# Patient Record
Sex: Male | Born: 1945
Health system: Southern US, Community
[De-identification: ages and names within clinical notes are randomized; demographics above are authoritative.]

## PROBLEM LIST (undated history)

## (undated) DIAGNOSIS — E7800 Pure hypercholesterolemia, unspecified: Secondary | ICD-10-CM

## (undated) DIAGNOSIS — N200 Calculus of kidney: Secondary | ICD-10-CM

## (undated) DIAGNOSIS — H269 Unspecified cataract: Secondary | ICD-10-CM

## (undated) HISTORY — DX: Calculus of kidney: N20.0

## (undated) HISTORY — DX: Unspecified cataract: H26.9

## (undated) HISTORY — PX: CATARACT EXTRACTION, BILATERAL: SHX1313

---

## 2012-11-21 ENCOUNTER — Ambulatory Visit (INDEPENDENT_AMBULATORY_CARE_PROVIDER_SITE_OTHER): Payer: Medicare Other | Admitting: Family Medicine

## 2012-11-21 VITALS — BP 112/70 | HR 74 | Temp 98.7°F | Resp 16 | Ht 61.5 in | Wt 141.0 lb

## 2012-11-21 DIAGNOSIS — L718 Other rosacea: Secondary | ICD-10-CM

## 2012-11-21 DIAGNOSIS — L719 Rosacea, unspecified: Secondary | ICD-10-CM | POA: Diagnosis not present

## 2012-11-21 MED ORDER — DOXYCYCLINE HYCLATE 100 MG PO TABS: 100.0000 mg | ORAL_TABLET | Freq: Two times a day (BID) | ORAL | Status: DC

## 2012-11-21 MED ORDER — CLINDAMYCIN PHOSPHATE 1 % EX GEL: Freq: Two times a day (BID) | CUTANEOUS | Status: DC

## 2012-11-21 NOTE — Addendum Note (Signed)
Addended by: Maryann Alar on: 11/21/2012 03:49 PM   Modules accepted: Level of Service

## 2012-11-21 NOTE — Patient Instructions (Addendum)
Avoid things that cause the rash to get worse: Potential triggers for flushing include: Extremes of temperature  Sunlight  Spicy foods  Alcohol  Exercise  Acute psychological stressors  Medications  Menopausal hot flashes     We will try a different medication for the face. If it does not work as well as the metronidazole, we will go back to using the metronidazole. The new medicine is clindamycin gel. I would recommend using a small amount of it on the red areas of the face twice daily every morning and evening.  We will also try using an oral medication in addition to that, doxycycline, one daily. Be cautious because people taking doxycycline will sometimes sunburn easier.  Always wear a hat if out in the sun, or sunscreen if you're going to be outdoors for a long time.

## 2012-11-21 NOTE — Progress Notes (Signed)
Subjective: Patient is here for a reassessment of his acne rosacea. He's been using metronidazole gel once daily, and does not get very good control. It is better in the wintertime worse in the summer. His face is red and inflamed. He says the medicine is extremely expensive and would like to do something because less if possible.  Objective: Extensive acne rosacea on the face, from the ears clear across and across the lower half of the forehead. It does not seem to affect the top of his head where he wears a hat. 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  Assessment: Acne rosacea, erythematous inflammatory type  Plan: See the detailed instructions I wrote out.  We will try clindamycin gel in place of the metronidazole gel and see how he does with that. It can help, though is not documented to be as reliable as metronidazole. Will also give some oral doxycycline and cautioned him about being out in the sun.  Return if he is not improving or in about 6 months.

## 2012-12-08 ENCOUNTER — Ambulatory Visit: Payer: Medicare Other

## 2012-12-08 ENCOUNTER — Ambulatory Visit (INDEPENDENT_AMBULATORY_CARE_PROVIDER_SITE_OTHER): Payer: Medicare Other | Admitting: Emergency Medicine

## 2012-12-08 VITALS — BP 118/64 | HR 65 | Temp 97.9°F | Resp 18 | Ht 61.0 in | Wt 140.0 lb

## 2012-12-08 DIAGNOSIS — Z7189 Other specified counseling: Secondary | ICD-10-CM | POA: Diagnosis not present

## 2012-12-08 NOTE — Progress Notes (Signed)
  Subjective:    Patient ID: Lee [name], male    DOB: May 16, 1946, 67 y.o.   MRN: [id number]  HPI   67 year old [place] male presents to the clinic to day asking for shingles vaccine.   Pt has no idea if he has had the chicken pox disease in the past.  Patient's mother in law contracted shingles last month.   Requests prescription for shingles vaccine.       Review of Systems     Objective:   Physical Exam Lungs clear, heart regular.        Assessment & Plan:

## 2012-12-17 ENCOUNTER — Ambulatory Visit: Payer: Medicare Other

## 2012-12-17 ENCOUNTER — Ambulatory Visit (INDEPENDENT_AMBULATORY_CARE_PROVIDER_SITE_OTHER): Payer: Medicare Other | Admitting: Family Medicine

## 2012-12-17 VITALS — BP 100/60 | HR 62 | Temp 98.1°F | Resp 16 | Ht 63.0 in | Wt 150.6 lb

## 2012-12-17 DIAGNOSIS — M549 Dorsalgia, unspecified: Secondary | ICD-10-CM | POA: Diagnosis not present

## 2012-12-17 NOTE — Patient Instructions (Addendum)
Try taking advil or tylenol as needed.  I am going to refer you to physical therapy for your back. Let me know if you are not feeling better in the next few months- Sooner if worse.

## 2012-12-17 NOTE — Progress Notes (Signed)
[facility] [address] [place] [address] [phone]  Date:  12/17/2012   Name:  Lee [name]   DOB:  1946/01/21   MRN:  [id number]  PCP:  No PCP Per Patient    Chief Complaint: Back Pain   History of Present Illness:  Lee [name] is a 67 y.o. very pleasant male patient who presents with the following:  He is here with back pain.  He feels that he is beocming bent over, "like I'm getting old."  His family has noticed this change in his posture. He notes it is difficult to straighten up, and he feels more comfortable in a hunched position.  He has noted this for a couple of years, but worse for the last 2 days or so.  He is in [employer] business and spends a lot of time bent over cooking  He feels most comfortable leaning over a shopping cart when out shopping.  He is at [employer] a lot for [employer] and has trouble picking up heavy supply boxes.    Picking up a 10 or 20 lb box is difficult for him.   He does not have any incontinence of stool or urine.     There are no active problems to display for this patient.   History reviewed. No pertinent past medical history.  History reviewed. No pertinent past surgical history.  History  Substance Use Topics  . Smoking status: Never Smoker   . Smokeless tobacco: Not on file  . Alcohol Use: Not on file    No family history on file.  No Known Allergies  Medication list has been reviewed and updated.  Current Outpatient Prescriptions on File Prior to Visit  Medication Sig Dispense Refill  . clindamycin (CLINDAGEL) 1 % gel Apply topically 2 (two) times daily.  60 g  0  . doxycycline (VIBRA-TABS) 100 MG tablet Take 1 tablet (100 mg total) by mouth 2 (two) times daily.  30 tablet  5  . metroNIDAZOLE (METROCREAM) 0.75 % cream Apply 1 application topically every morning.       No current facility-administered medications on file prior to visit.    Review of Systems:  As per HPI-  otherwise negative.   Physical Examination: Filed Vitals:   12/17/12 0856  BP: 100/60  Pulse: 62  Temp: 98.1 F (36.7 C)  Resp: 16   Filed Vitals:   12/17/12 0856  Height: 5\' 3"  (1.6 m)  Weight: 150 lb 9.6 oz (68.312 kg)   Body mass index is 26.68 kg/(m^2). Ideal Body Weight: Weight in (lb) to have BMI = 25: 140.8  GEN: WDWN, NAD, Non-toxic, A & O x 3, slightly stooped apperaance HEENT: Atraumatic, Normocephalic. Neck supple. No masses, No LAD. Ears and Nose: No external deformity. CV: RRR, No M/G/R. No JVD. No thrill. No extra heart sounds. PULM: CTA B, no wheezes, crackles, rhonchi. No retractions. No resp. distress. No accessory muscle use. ABD: S, NT, ND EXTR: No c/c/e NEURO Normal gait.  PSYCH: Normally interactive. Conversant. Not depressed or anxious appearing.  Calm demeanor.  Back: he does show changes of kyphosis in his posture, no obvious scoliosis on exam.  He has normal leg strength and sensation, negative SLR bilaterally, normal patellar DTR.    [facility] reading (PRIMARY) by  Dr. Patsy Lager. Lumbar spine: degenerative change, otherwise negative Thoracic spine:  Degenerative change  LUMBAR SPINE - 2-3 VIEW  Comparison: None.  Findings: There are five non-rib bearing lumbar-type vertebral  bodies. Intervertebral disc spaces are relatively well maintained. Multilevel marginal osteophyte formation is present representing degenerative spondylosis. No fracture, subluxation, or bony destruction is evident. SI joints appear intact.  There is mild lumbar scoliosis convexity to the left with Cobbs angle measuring 6 degrees. No kyphosis is evident. There is fecal distention of portions of the colon.  IMPRESSION: Changes of degenerative disc disease and degenerative spondylosis are present.  There is mild scoliosis convexity to the left with Cobbs angle measuring 6 degrees.  THORACIC SPINE - 2 VIEW  Comparison: None.  Findings: Ectasia and nonaneurysmal  calcification of the thoracic  aorta are seen. There is narrowing of some intervertebral disc  spaces. Multilevel marginal osteophyte formation is seen  representing degenerative spondylosis. No fracture, subluxation,  dislocation, or bony destruction is seen.  There is slight thoracic scoliosis convexity to the right with  Cobbs angle measuring 5 degrees.  IMPRESSION:  Ectasia and nonaneurysmal calcification of the thoracic aorta are  seen. Changes of degenerative disc disease and degenerative  spondylosis are present. No fracture or bony destruction is  evident.  There is slight thoracic scoliosis convexity to the right with  Cobbs angle measuring 5 degrees.   Assessment and Plan: Back pain - Plan: DG Thoracic Spine 2 View, DG Lumbar Spine 2-3 Views, Ambulatory referral to Physical Therapy  Back pain with kyphosis, may be related to chronic bending over.  Degenerative change on x-ray. Referral to PT for strengthening exercises.  Encouraged him to come in for a CPE at his convenience to evalaute his general health.   Signed Abbe Amsterdam, MD

## 2013-01-03 DIAGNOSIS — M545 Low back pain: Secondary | ICD-10-CM | POA: Diagnosis not present

## 2013-01-06 DIAGNOSIS — M545 Low back pain: Secondary | ICD-10-CM | POA: Diagnosis not present

## 2013-01-08 DIAGNOSIS — M545 Low back pain: Secondary | ICD-10-CM | POA: Diagnosis not present

## 2013-01-13 DIAGNOSIS — M545 Low back pain: Secondary | ICD-10-CM | POA: Diagnosis not present

## 2013-01-15 DIAGNOSIS — M545 Low back pain: Secondary | ICD-10-CM | POA: Diagnosis not present

## 2013-01-20 DIAGNOSIS — M545 Low back pain: Secondary | ICD-10-CM | POA: Diagnosis not present

## 2013-01-22 DIAGNOSIS — M545 Low back pain: Secondary | ICD-10-CM | POA: Diagnosis not present

## 2013-01-27 DIAGNOSIS — M545 Low back pain: Secondary | ICD-10-CM | POA: Diagnosis not present

## 2013-01-29 DIAGNOSIS — M545 Low back pain: Secondary | ICD-10-CM | POA: Diagnosis not present

## 2013-04-21 DIAGNOSIS — Z23 Encounter for immunization: Secondary | ICD-10-CM | POA: Diagnosis not present

## 2014-01-07 ENCOUNTER — Ambulatory Visit (INDEPENDENT_AMBULATORY_CARE_PROVIDER_SITE_OTHER): Payer: Medicare Other | Admitting: Family Medicine

## 2014-01-07 VITALS — BP 100/62 | HR 63 | Temp 97.8°F | Resp 16 | Ht 62.0 in | Wt 147.6 lb

## 2014-01-07 DIAGNOSIS — L719 Rosacea, unspecified: Secondary | ICD-10-CM

## 2014-01-07 DIAGNOSIS — L718 Other rosacea: Secondary | ICD-10-CM

## 2014-01-07 MED ORDER — CLINDAMYCIN PHOSPHATE 1 % EX GEL: Freq: Two times a day (BID) | CUTANEOUS | Status: DC

## 2014-01-07 MED ORDER — DOXYCYCLINE HYCLATE 100 MG PO TABS: ORAL_TABLET | ORAL | Status: DC

## 2014-01-07 NOTE — Progress Notes (Signed)
Subjective: 68, almost 68 year old man who is here again for his acne rosacea. He continues to have problems. It is worse on the right cheek than the left, though it was on the left at times. It is been bothering him more lately. 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 No other acute medical complaints.  Objective: Conjunctiva were not injected. Acne rosacea appearing rash on the right side of his face from the side of the nose down to the sides of the lips. Mother on the right, little bit of left. No carotid bruits.  Assessment: Acne rosacea  Plan: Continue using the clindamycin gel, but also take doxycycline one twice daily. He is to do this for 3 weeks, then decrease to once daily. If he is not doing better he is to come back, otherwise he is to continue using the oral doxycycline for the summer months. He is cautioned about the risks of sunburn.  He says his high body is not doing well, he has trouble standing straight. I advised him to come in for a complete physical examination sometime in the not too distant future. He will come back after he goes to [place] in early July. He works 7 days a week all day.

## 2014-01-07 NOTE — Patient Instructions (Signed)
Continue using the clindamycin gel twice daily if bad, once daily if doing well  Take the doxycycline one twice daily for 3 weeks, then decrease to one once daily. Take with breakfast and supper.  Be cautious about excessive sun as you may burn a little bit he's you're on this medication  Return if worse or not improving

## 2014-03-19 DIAGNOSIS — Z23 Encounter for immunization: Secondary | ICD-10-CM | POA: Diagnosis not present

## 2014-05-09 IMAGING — CR DG LUMBAR SPINE 2-3V
2 series · 2 of 2 positions shown · non-contrast
Comparison: None.

CLINICAL DATA: History of back pain.  Evaluate scoliosis.

LUMBAR SPINE - 2-3 VIEW

[AP]
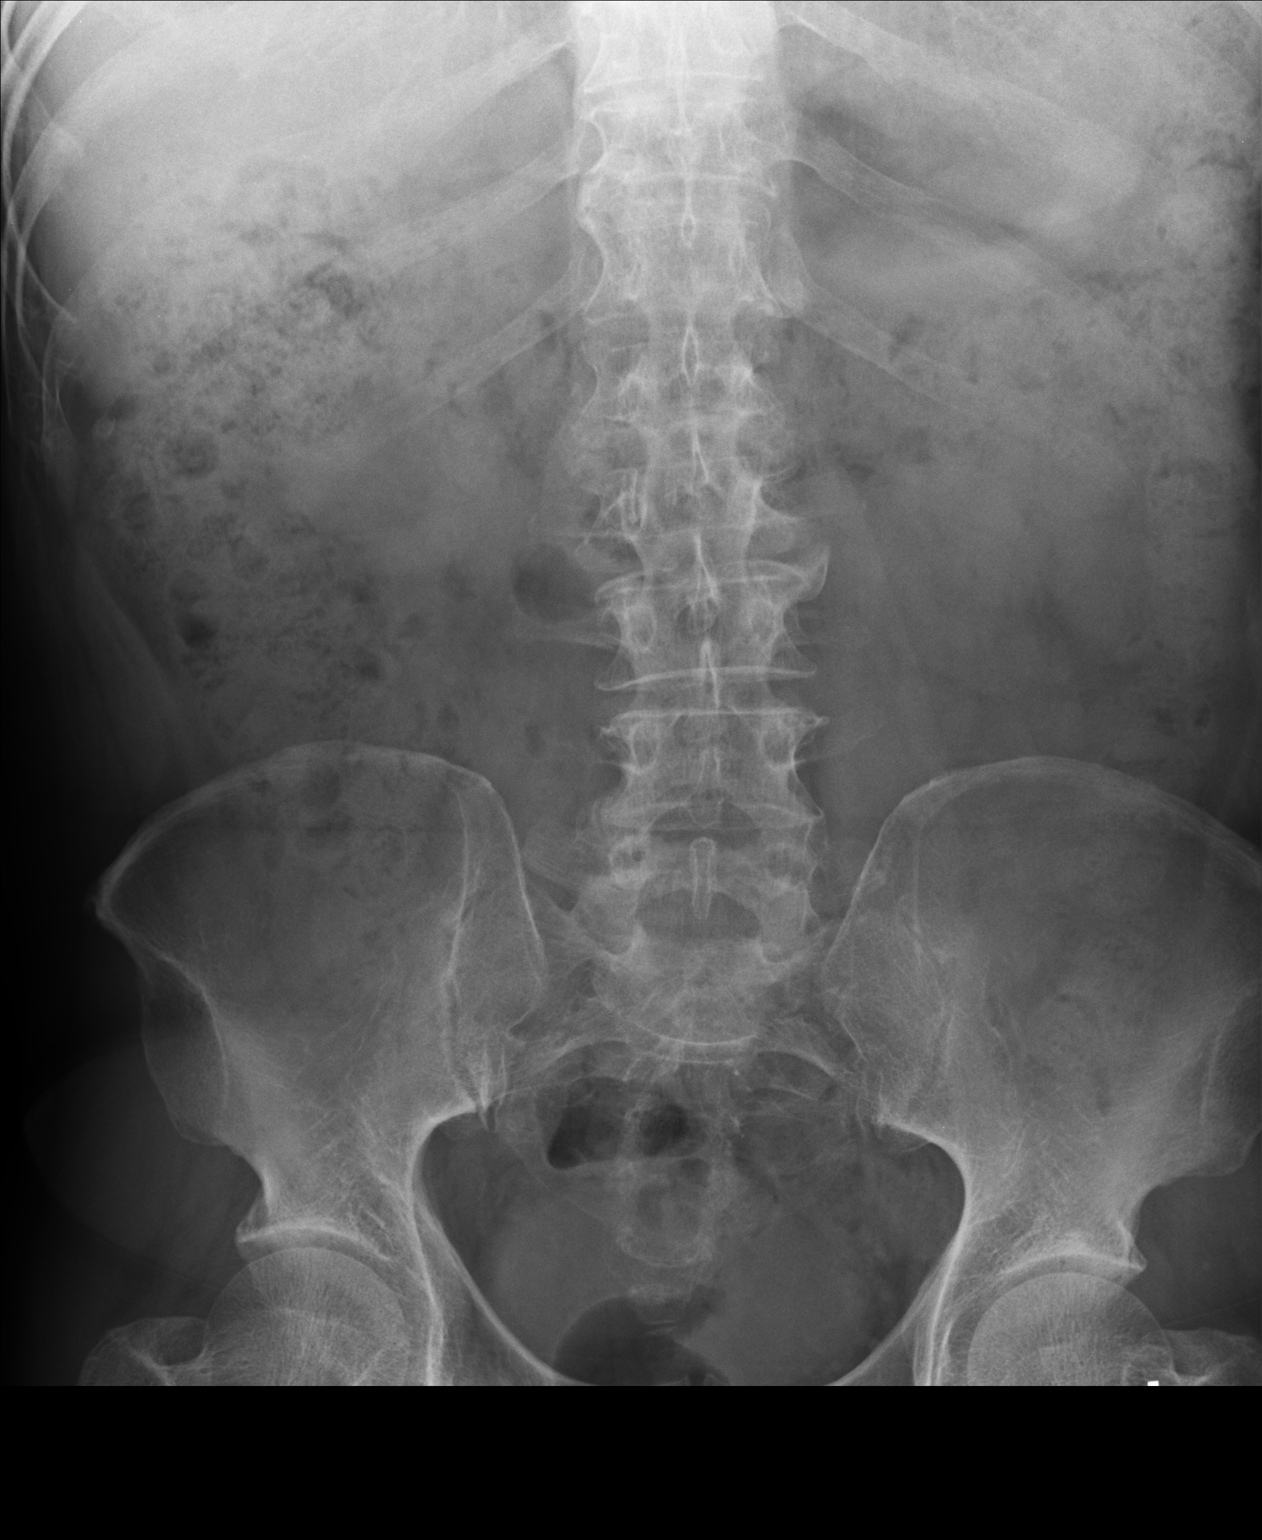

[lateral]
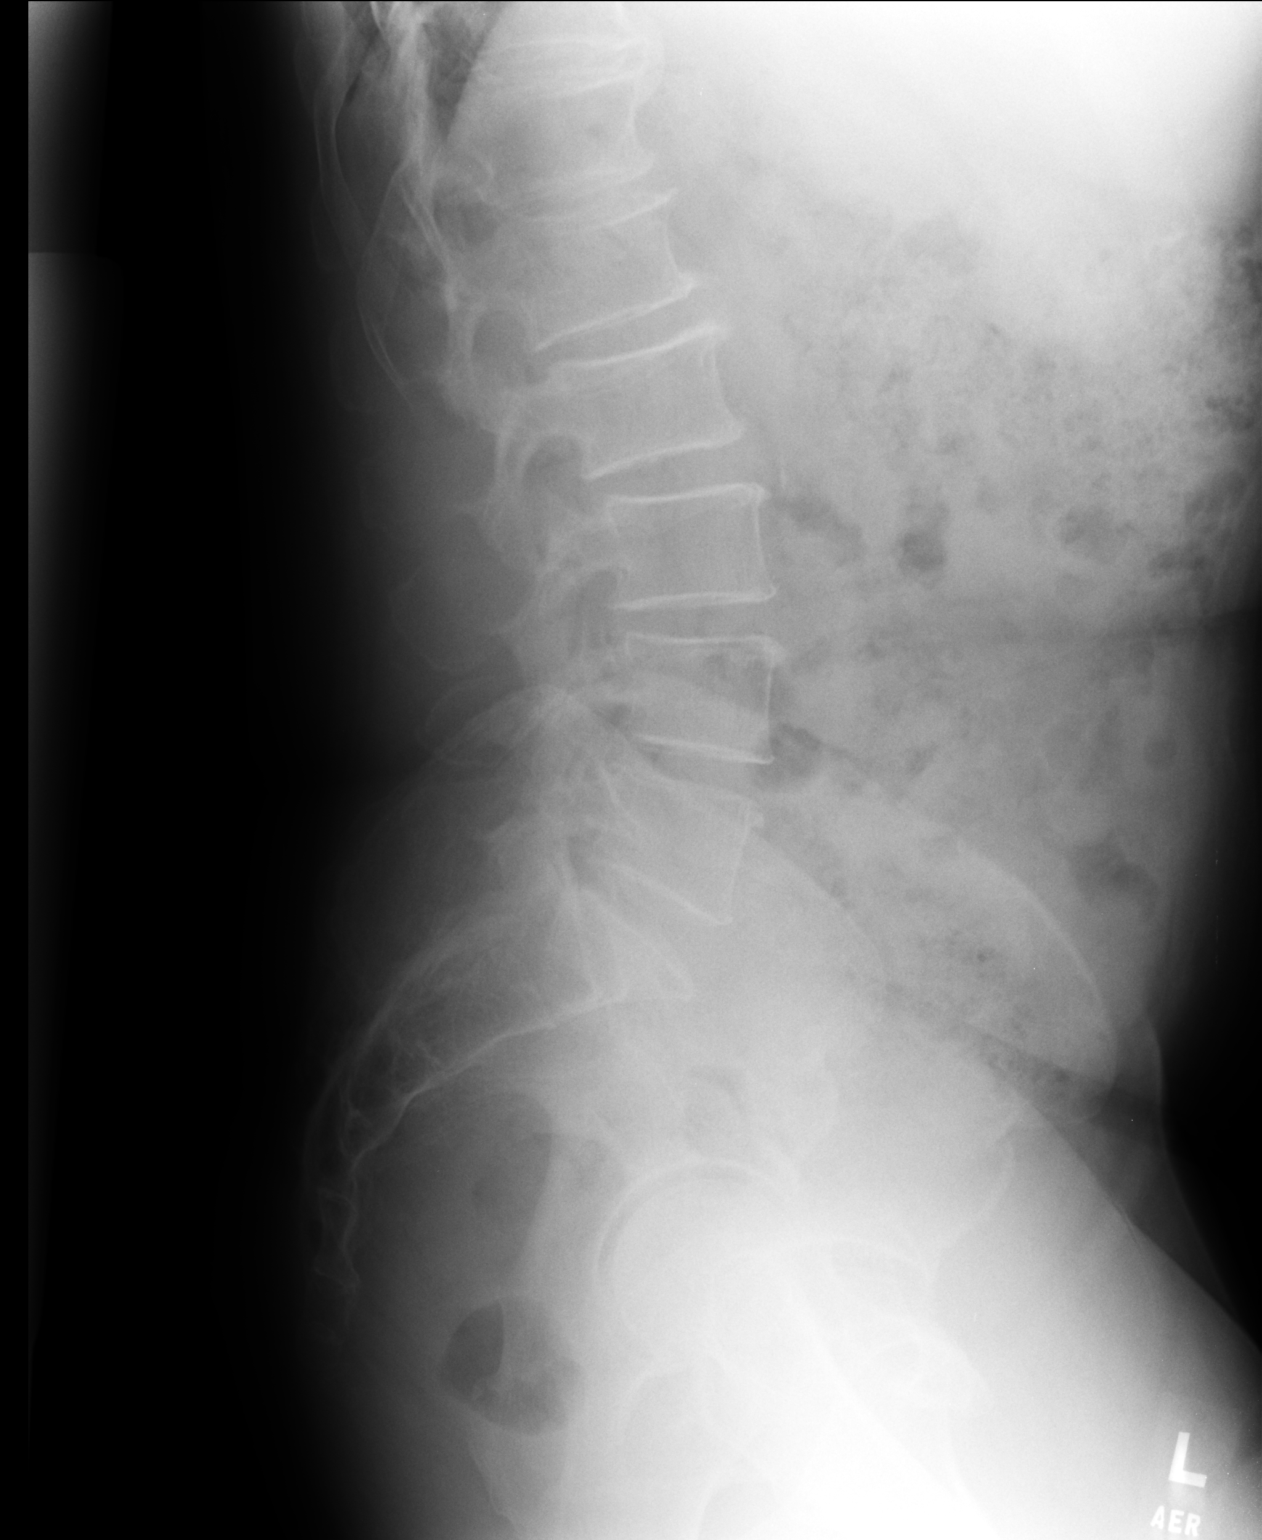

[2 of 2 positions shown; findings below may reference images not displayed]

FINDINGS: There are five non-rib bearing lumbar-type vertebral
bodies.  Intervertebral disc spaces are relatively well maintained.
Multilevel marginal osteophyte formation is present representing
degenerative spondylosis.  No fracture, subluxation, or bony
destruction is evident.  SI joints appear intact.

There is mild lumbar scoliosis convexity to the left with Cobbs
angle measuring 6 degrees.  No kyphosis is evident.  There is fecal
distention of portions of the colon.
IMPRESSION: Changes of degenerative disc disease and degenerative spondylosis
are present.

There is mild scoliosis convexity to the left with Cobbs angle
measuring 6 degrees.

## 2014-08-07 HISTORY — PX: COLONOSCOPY: SHX174

## 2014-08-07 HISTORY — PX: POLYPECTOMY: SHX149

## 2014-11-13 ENCOUNTER — Encounter: Payer: Self-pay | Admitting: Internal Medicine

## 2014-12-27 ENCOUNTER — Ambulatory Visit (INDEPENDENT_AMBULATORY_CARE_PROVIDER_SITE_OTHER): Payer: Medicare Other | Admitting: Physician Assistant

## 2014-12-27 ENCOUNTER — Ambulatory Visit (INDEPENDENT_AMBULATORY_CARE_PROVIDER_SITE_OTHER): Payer: Medicare Other

## 2014-12-27 VITALS — BP 120/68 | HR 61 | Temp 98.0°F | Resp 12 | Ht 61.5 in | Wt 142.0 lb

## 2014-12-27 DIAGNOSIS — R109 Unspecified abdominal pain: Secondary | ICD-10-CM

## 2014-12-27 LAB — POCT URINALYSIS DIPSTICK
Bilirubin, UA: NEGATIVE
Blood, UA: NEGATIVE
Glucose, UA: NEGATIVE
Ketones, UA: NEGATIVE
LEUKOCYTES UA: NEGATIVE
NITRITE UA: NEGATIVE
Protein, UA: NEGATIVE
Spec Grav, UA: 1.01
UROBILINOGEN UA: 0.2
pH, UA: 7

## 2014-12-27 MED ORDER — MELOXICAM 15 MG PO TABS: 15.0000 mg | ORAL_TABLET | Freq: Every day | ORAL | Status: DC | PRN

## 2014-12-27 NOTE — Progress Notes (Signed)
  Medical screening examination/treatment/procedure(s) were performed by non-physician practitioner and as supervising physician I was immediately available for consultation/collaboration.     

## 2014-12-27 NOTE — Patient Instructions (Signed)
Heat or ice to the area whichever feels the best on the area that is hurting.  Be careful picking up heavy things.

## 2014-12-27 NOTE — Progress Notes (Signed)
Subjective:    Patient ID: Lee [name], male    DOB: Jun 03, 1946, 69 y.o.   MRN: [id number]  HPI Pt presents to clinic with right mid back pain that started last night and has not changed.  It hurts more with movement esp changing from sitting to standing.  He did not have an injury that he knows of.  He was working last night at [employer] but doing nothing different than normal.  He ate 2 donuts before bed but he had no abd pain or nausea.  He is having no urinary symptoms other than he does to the bedroom at lot but he states it has not changed and he drinks a lot of water during the day.  Review of Systems  Constitutional: Negative for fever and chills.  Gastrointestinal: Negative for nausea, abdominal pain and diarrhea.  Genitourinary: Positive for frequency (no change for the patient). Negative for urgency and hematuria.  Musculoskeletal: Positive for back pain.   There are no active problems to display for this patient.  The patient has a current medication list which includes the following prescription(s): pyridoxine. No Known Allergies  Medications, allergies, past medical history, surgical history, family history, social history and problem list reviewed and updated.     Objective:   Physical Exam  Constitutional: He is oriented to person, place, and time. He appears well-developed and well-nourished.  BP 120/68 mmHg  Pulse 61  Temp(Src) 98 F (36.7 C) (Oral)  Resp 12  Ht 5' 1.5" (1.562 m)  Wt 142 lb (64.411 kg)  BMI 26.40 kg/m2  SpO2 98%   HENT:  Head: Normocephalic and atraumatic.  Right Ear: External ear normal.  Left Ear: External ear normal.  Eyes: Conjunctivae are normal.  Neck: Normal range of motion.  Cardiovascular: Normal rate, regular rhythm and normal heart sounds.   No murmur heard. Pulmonary/Chest: Effort normal and breath sounds normal. He has no wheezes.  Musculoskeletal:       Thoracic back: Normal.       Lumbar back: Normal.   Arms: Significant kyphotic upper back.  Neurological: He is alert and oriented to person, place, and time. He has normal strength. No sensory deficit.  Reflex Scores:      Patellar reflexes are 2+ on the right side and 2+ on the left side.      Achilles reflexes are 2+ on the right side and 2+ on the left side. Skin: Skin is warm and dry.  Psychiatric: He has a normal mood and affect. His behavior is normal. Judgment and thought content normal.    Results for orders placed or performed in visit on 12/27/14  POCT urinalysis dipstick  Result Value Ref Range   Color, UA yellow    Clarity, UA clear    Glucose, UA neg    Bilirubin, UA neg    Ketones, UA neg    Spec Grav, UA 1.010    Blood, UA neg    pH, UA 7.0    Protein, UA neg    Urobilinogen, UA 0.2    Nitrite, UA neg    Leukocytes, UA Negative    [facility] reading (PRIMARY) by  Dr. Ouida Sills.  Neg      Assessment & Plan:  Right flank pain - Plan: DG Ribs Unilateral W/Chest Right, POCT urinalysis dipstick, meloxicam (MOBIC) 15 MG tablet   This is most likely a MSK injury. There is no rib fracture seen.  We will treat with NSAIDs and he will RTC  if his pain changes or he develops other symptoms.  Windell Hummingbird PA-C  [facility] 12/27/2014 9:54 AM

## 2015-01-06 ENCOUNTER — Encounter: Payer: Self-pay | Admitting: Internal Medicine

## 2015-01-06 ENCOUNTER — Ambulatory Visit (INDEPENDENT_AMBULATORY_CARE_PROVIDER_SITE_OTHER): Payer: Medicare Other | Admitting: Internal Medicine

## 2015-01-06 VITALS — BP 136/66 | HR 64 | Ht 61.0 in | Wt 146.1 lb

## 2015-01-06 DIAGNOSIS — K219 Gastro-esophageal reflux disease without esophagitis: Secondary | ICD-10-CM

## 2015-01-06 DIAGNOSIS — R1013 Epigastric pain: Secondary | ICD-10-CM | POA: Diagnosis not present

## 2015-01-06 DIAGNOSIS — Z1211 Encounter for screening for malignant neoplasm of colon: Secondary | ICD-10-CM | POA: Diagnosis not present

## 2015-01-06 MED ORDER — NA SULFATE-K SULFATE-MG SULF 17.5-3.13-1.6 GM/177ML PO SOLN: 1.0000 | Freq: Once | ORAL | Status: DC

## 2015-01-06 NOTE — Patient Instructions (Signed)
You have been scheduled for an endoscopy and colonoscopy. Please follow the written instructions given to you at your visit today. Please pick up your prep supplies at the pharmacy within the next 1-3 days. If you use inhalers (even only as needed), please bring them with you on the day of your procedure.  

## 2015-01-06 NOTE — Progress Notes (Signed)
HISTORY OF PRESENT ILLNESS:  Lee [name] is a 69 y.o. male , [name] who has lived in the [place] states since 1972, who is self referred regarding screening colonoscopy and epigastric discomfort. The patient reports no prior history of GI evaluations. He was encouraged to be seen by his wife. Patient reports intermittent problems with epigastric discomfort which she describes as a sour stomach. For this he takes an acids or Pepto-Bismol with relief. He does not describe this as severe pain. Occasional heartburn. No nausea, vomiting, dysphagia, or weight loss. His father had stomach cancer. GI review of systems is otherwise negative. No family history of colon cancer. The patient owns restaurant . He receives as needed medical care from [facility], Dr. Linna Darner  REVIEW OF SYSTEMS:  All non-GI ROS negative upon complete review  Past Medical History  Diagnosis Date  . Kidney stones     History reviewed. No pertinent past surgical history.  Social History Jyden Corbit  reports that he has never smoked. He has never used smokeless tobacco. He reports that he does not drink alcohol or use illicit drugs.  family history includes Stomach cancer in his father.  No Known Allergies     PHYSICAL EXAMINATION: Vital signs: BP 136/66 mmHg  Pulse 64  Ht 5\' 1"  (1.549 m)  Wt 146 lb 2 oz (66.282 kg)  BMI 27.62 kg/m2  Constitutional: generally well-appearing, no acute distress Psychiatric: alert and oriented x3, cooperative Eyes: extraocular movements intact, anicteric, conjunctiva pink Mouth: oral pharynx moist, no lesions Neck: supple no lymphadenopathy Cardiovascular: heart regular rate and rhythm, no murmur Lungs: clear to auscultation bilaterally Abdomen: soft, nontender, nondistended, no obvious ascites, no peritoneal signs, normal bowel sounds, no organomegaly Rectal: Deferred until colonoscopy Extremities: no lower extremity edema bilaterally Skin: no  lesions on visible extremities Neuro: No focal deficits. No asterixis.    ASSESSMENT:  #1. Epigastric discomfort/dyspepsia. May be GERD equivalent. Infrequent and managed with an acids and Pepto-Bismol #2. Family history of gastric cancer in his father #3. Screening colonoscopy. Baseline risk without contraindication. The patient is motivated  PLAN:  #1. Diagnostic upper endoscopy.The nature of the procedure, as well as the risks, benefits, and alternatives were carefully and thoroughly reviewed with the patient. Ample time for discussion and questions allowed. The patient understood, was satisfied, and agreed to proceed. #2. Consider more regular acid suppressive therapy pending findings. Otherwise an acids as needed if unremarkable. Rule out Helicobacter pylori given family history and ethnicity #3. Screening colonoscopy.The nature of the procedure, as well as the risks, benefits, and alternatives were carefully and thoroughly reviewed with the patient. Ample time for discussion and questions allowed. The patient understood, was satisfied, and agreed to proceed.

## 2015-02-17 ENCOUNTER — Ambulatory Visit (AMBULATORY_SURGERY_CENTER): Payer: Medicare Other | Admitting: Internal Medicine

## 2015-02-17 ENCOUNTER — Encounter: Payer: Self-pay | Admitting: Internal Medicine

## 2015-02-17 VITALS — BP 117/62 | HR 63 | Temp 98.3°F | Resp 20 | Ht 61.0 in | Wt 146.0 lb

## 2015-02-17 DIAGNOSIS — D125 Benign neoplasm of sigmoid colon: Secondary | ICD-10-CM | POA: Diagnosis not present

## 2015-02-17 DIAGNOSIS — Z1211 Encounter for screening for malignant neoplasm of colon: Secondary | ICD-10-CM | POA: Diagnosis not present

## 2015-02-17 DIAGNOSIS — R1013 Epigastric pain: Secondary | ICD-10-CM | POA: Diagnosis not present

## 2015-02-17 DIAGNOSIS — K219 Gastro-esophageal reflux disease without esophagitis: Secondary | ICD-10-CM | POA: Diagnosis not present

## 2015-02-17 MED ORDER — SODIUM CHLORIDE 0.9 % IV SOLN: 500.0000 mL | INTRAVENOUS | Status: DC

## 2015-02-17 NOTE — Progress Notes (Signed)
A/ox3, pleased with MAC, report to RN 

## 2015-02-17 NOTE — Op Note (Signed)
[place]  [address]. [place], [address]   COLONOSCOPY PROCEDURE REPORT  PATIENT: Lee [name], Lee [name]  MR#: [id number] BIRTHDATE: Dec 22, 1945 , 20  yrs. old GENDER: male ENDOSCOPIST: Eustace Quail, MD REFERRED MI:WOEHO Webber, [place]. PROCEDURE DATE:  02/17/2015 PROCEDURE:   Colonoscopy, screening and Colonoscopy with snare polypectomy x 1 First Screening Colonoscopy - Avg.  risk and is 50 yrs.  old or older Yes.  Prior Negative Screening - Now for repeat screening. N/A  History of Adenoma - Now for follow-up colonoscopy & has been > or = to 3 yrs.  N/A  Polyps removed today? Yes ASA CLASS:   Class I INDICATIONS:Screening for colonic neoplasia and Colorectal Neoplasm Risk Assessment for this procedure is average risk. MEDICATIONS: Monitored anesthesia care and Propofol 220 mg IV  DESCRIPTION OF PROCEDURE:   After the risks benefits and alternatives of the procedure were thoroughly explained, informed consent was obtained.  The digital rectal exam revealed no abnormalities of the rectum.   The LB [id number]  endoscope was introduced through the anus and advanced to the cecum, which was identified by both the appendix and ileocecal valve. No adverse events experienced.   The quality of the prep was excellent. (Suprep was used)  The instrument was then slowly withdrawn as the colon was fully examined. Estimated blood loss is zero unless otherwise noted in this procedure report.   COLON FINDINGS: The examined terminal ileum appeared to be normal. A single polyp measuring 3 mm in size was found in the sigmoid colon.  A polypectomy was performed with a cold snare.  The resection was complete, the polyp tissue was completely retrieved and sent to histology.   The examination was otherwise normal. Retroflexed views revealed internal hemorrhoids. The time to cecum = 1.9 Withdrawal time = 14.3   The scope was withdrawn and the procedure completed. COMPLICATIONS:  There were no immediate complications.  ENDOSCOPIC IMPRESSION: 1.   The examined terminal ileum appeared to be normal 2.   Single polyp was found in the sigmoid colon; polypectomy was performed with a cold snare 3.   The examination was otherwise normal  RECOMMENDATIONS: 1.  Repeat colonoscopy in 5 years if polyp adenomatous; otherwise 10 years 2.  Upper endoscopy today (please see report)  eSigned:  Eustace Quail, MD 02/17/2015 3:15 PM   cc: The Patient and Bernita Buffy [facility]

## 2015-02-17 NOTE — Patient Instructions (Signed)

## 2015-02-17 NOTE — Progress Notes (Signed)
Called to room to assist during endoscopic procedure.  Patient ID and intended procedure confirmed with present staff. Received instructions for my participation in the procedure from the performing physician.  

## 2015-02-17 NOTE — Op Note (Signed)
[place]  [address]. [place], [address]   ENDOSCOPY PROCEDURE REPORT  PATIENT: Lee [name], Lee [name]  MR#: [id number] BIRTHDATE: [birth year] , 52  yrs. old GENDER: male ENDOSCOPIST: Eustace Quail, MD REFERRED BY:  Bernita Buffy, PAC. PROCEDURE DATE:  02/17/2015 PROCEDURE:  EGD w/ biopsy ASA CLASS:     Class I INDICATIONS:  dyspepsia.  . Father with a history of gastric cancer MEDICATIONS: Monitored anesthesia care and Propofol 80 mg IV TOPICAL ANESTHETIC: none  DESCRIPTION OF PROCEDURE: After the risks benefits and alternatives of the procedure were thoroughly explained, informed consent was obtained.  The LB [id number] endoscope was introduced through the mouth and advanced to the second portion of the duodenum , Without limitations.  The instrument was slowly withdrawn as the mucosa was fully examined.    EXAM: The esophagus and gastroesophageal junction were completely normal in appearance.  The stomach was entered and closely examined.The antrum, angularis, and lesser curvature were well visualized, including a retroflexed view of the cardia and fundus. The stomach wall was normally distensable.  The scope passed easily through the pylorus into the duodenum.  Retroflexed views revealed no abnormalities.     The scope was then withdrawn from the patient and the procedure completed.  COMPLICATIONS: There were no immediate complications.  ENDOSCOPIC IMPRESSION: 1. Normal EGD 2. Status post CLO biopsy to rule out Helicobacter pylori  RECOMMENDATIONS: 1.  Continue current medications 2.  Rx CLO if positive  REPEAT EXAM:  eSigned:  Eustace Quail, MD 02/17/2015 3:18 PM    CC:The Patient and Bernita Buffy PA

## 2015-02-22 ENCOUNTER — Encounter: Payer: Self-pay | Admitting: Internal Medicine

## 2015-02-23 LAB — HELICOBACTER PYLORI SCREEN-BIOPSY: UREASE: POSITIVE — AB

## 2015-03-03 ENCOUNTER — Other Ambulatory Visit: Payer: Self-pay

## 2015-03-03 MED ORDER — AMOXICILL-CLARITHRO-LANSOPRAZ PO MISC: Freq: Two times a day (BID) | ORAL | Status: DC

## 2015-03-05 ENCOUNTER — Telehealth: Payer: Self-pay | Admitting: Internal Medicine

## 2015-03-05 NOTE — Telephone Encounter (Signed)
Prev-pak is not covered by insurance.  Pharmacy will give him all the separate components.  Amoxicillin 1000 mg bid Clarithromycin 500 mg BID  Prevacid 30 mg BID  Wife notified

## 2015-03-08 ENCOUNTER — Telehealth: Payer: Self-pay

## 2015-03-08 NOTE — Telephone Encounter (Signed)
Prevpac approved

## 2015-03-08 NOTE — Telephone Encounter (Signed)
Faxed prior authorization for Prevpac.  Awaiting response.

## 2015-04-23 DIAGNOSIS — Z23 Encounter for immunization: Secondary | ICD-10-CM | POA: Diagnosis not present

## 2015-05-28 DIAGNOSIS — H2513 Age-related nuclear cataract, bilateral: Secondary | ICD-10-CM | POA: Diagnosis not present

## 2015-06-15 ENCOUNTER — Ambulatory Visit (INDEPENDENT_AMBULATORY_CARE_PROVIDER_SITE_OTHER): Payer: Medicare Other

## 2015-06-15 ENCOUNTER — Ambulatory Visit (INDEPENDENT_AMBULATORY_CARE_PROVIDER_SITE_OTHER): Payer: Medicare Other | Admitting: Internal Medicine

## 2015-06-15 VITALS — BP 122/70 | HR 61 | Temp 98.1°F | Resp 16 | Ht 62.0 in | Wt 146.0 lb

## 2015-06-15 DIAGNOSIS — M25511 Pain in right shoulder: Secondary | ICD-10-CM

## 2015-06-15 MED ORDER — METHYLPREDNISOLONE ACETATE 80 MG/ML IJ SUSP
80.0000 mg | Freq: Once | INTRAMUSCULAR | Status: AC
  Administered 2015-06-15: 80 mg via INTRAMUSCULAR

## 2015-06-15 MED ORDER — IBUPROFEN 600 MG PO TABS: 600.0000 mg | ORAL_TABLET | Freq: Three times a day (TID) | ORAL | Status: DC | PRN

## 2015-06-15 NOTE — Patient Instructions (Signed)

## 2015-06-15 NOTE — Progress Notes (Signed)
Patient ID: Lee [name], male   DOB: July 04, 1946, 69 y.o.   MRN: [id number]   06/15/2015 at 9:59 AM  Lee [name] / DOB: 1946/01/25 / MRN: [id number]  Problem list reviewed and updated by me where necessary.   SUBJECTIVE  Lee [name] is a 69 y.o. 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 The pain is posterior shoulder, worse with abduction..     He  has a past medical history of Kidney stones.    Medications reviewed and updated by myself where necessary, and exist elsewhere in the encounter.   Lee [name] has No Known Allergies. He  reports that he has never smoked. He has never used smokeless tobacco. He reports that he does not drink alcohol or use illicit drugs. He  has no sexual activity history on file. The patient  has no past surgical history on file.  His family history includes Stomach cancer in his father. There is no history of Colon cancer.  Review of Systems  Constitutional: Negative.  Negative for fever.  Respiratory: Negative.  Negative for shortness of breath.   Cardiovascular: Negative for chest pain.  Gastrointestinal: Negative for nausea.  Musculoskeletal: Positive for joint pain.  Skin: Negative for rash.  Neurological: Negative for dizziness and headaches.    OBJECTIVE  His  height is 5\' 2"  (1.575 m) and weight is 146 lb (66.225 kg). His oral temperature is 98.1 F (36.7 C). His blood pressure is 122/70 and his pulse is 61. His respiration is 16 and oxygen saturation is 98%.  The patient's body mass index is 26.7 kg/(m^2).  Physical Exam  Constitutional: He is oriented to person, place, and time. He appears well-developed and well-nourished. No distress.  HENT:  Head: Normocephalic.  Nose: Nose normal.  Eyes: Conjunctivae and EOM are normal.  Respiratory: Effort normal.  Musculoskeletal: He exhibits tenderness. He exhibits no edema.   Right shoulder: He exhibits tenderness, pain and spasm. He exhibits normal range of motion, no bony tenderness, no swelling, no effusion, no crepitus, no deformity, normal pulse and normal strength.       Arms: Point tender at x  Neurological: He is alert and oriented to person, place, and time. He exhibits normal muscle tone. Coordination normal.  Skin: No rash noted.  Psychiatric: He has a normal mood and affect.   [facility] reading (PRIMARY) by  Dr.Ica Daye.normal shoulder xray, bone cyst seen at elbow.    No results found for this or any previous visit (from the past 24 hour(s)).  ASSESSMENT & PLAN  Lee [name] was seen today for shoulder pain.  Diagnoses and all orders for this visit:  Pain in joint of right shoulder -     DG Shoulder Right; Future -     methylPREDNISolone acetate (DEPO-MEDROL) injection 80 mg; Inject 1 mL (80 mg total) into the muscle once. -     ibuprofen (ADVIL,MOTRIN) 600 MG tablet; Take 1 tablet (600 mg total) by mouth every 8 (eight) hours as needed.  Use aspercream

## 2015-11-02 ENCOUNTER — Ambulatory Visit (INDEPENDENT_AMBULATORY_CARE_PROVIDER_SITE_OTHER): Payer: Medicare Other

## 2015-11-02 ENCOUNTER — Ambulatory Visit (INDEPENDENT_AMBULATORY_CARE_PROVIDER_SITE_OTHER): Payer: Medicare Other | Admitting: Physician Assistant

## 2015-11-02 VITALS — BP 116/64 | HR 73 | Temp 98.4°F | Resp 18 | Ht 62.0 in | Wt 144.0 lb

## 2015-11-02 DIAGNOSIS — M546 Pain in thoracic spine: Secondary | ICD-10-CM

## 2015-11-02 DIAGNOSIS — R0781 Pleurodynia: Secondary | ICD-10-CM | POA: Diagnosis not present

## 2015-11-02 MED ORDER — CYCLOBENZAPRINE HCL 5 MG PO TABS: 5.0000 mg | ORAL_TABLET | Freq: Three times a day (TID) | ORAL | Status: DC | PRN

## 2015-11-02 MED ORDER — MELOXICAM 7.5 MG PO TABS: 7.5000 mg | ORAL_TABLET | Freq: Every day | ORAL | Status: DC

## 2015-11-02 NOTE — Progress Notes (Signed)
   Lee [name]  MRN: [id number] DOB: 1946-01-25  Subjective:  Pt presents to clinic with left side pain.  Two days ago he was working and bent forward to pick up a box hurt his left thoracic back/midaxillary ribs. Heard a loud noise, describes as cracking, and developed pain his his back. Mild pain for the past two days, today the sharp pain increased in severity. No pain with heavy lifting, endorses pain with lateral rotation of trunk. Currently taking advil and tylenol 1 pill each which provides minimal relief. Denies pain with coughing, denies shortness of breath or pain on inspiration. Denies pain radiation to the arms or legs.   There are no active problems to display for this patient.   Current Outpatient Prescriptions on File Prior to Visit  Medication Sig Dispense Refill  . ibuprofen (ADVIL,MOTRIN) 600 MG tablet Take 1 tablet (600 mg total) by mouth every 8 (eight) hours as needed. (Patient not taking: Reported on 11/02/2015) 30 tablet 0  . pyridOXINE (VITAMIN B-6) 100 MG tablet Take 100 mg by mouth daily. Reported on 11/02/2015     No current facility-administered medications on file prior to visit.    No Known Allergies  Review of Systems  Respiratory: Negative for cough and shortness of breath.   Musculoskeletal:       Left rib pain   Objective:  BP 116/64 mmHg  Pulse 73  Temp(Src) 98.4 F (36.9 C)  Resp 18  Ht 5\' 2"  (1.575 m)  Wt 144 lb (65.318 kg)  BMI 26.33 kg/m2  SpO2 95%  Physical Exam  Constitutional: He is oriented to person, place, and time and well-developed, well-nourished, and in no distress.  HENT:  Head: Normocephalic and atraumatic.  Right Ear: External ear normal.  Left Ear: External ear normal.  Eyes: Conjunctivae are normal.  Neck: Normal range of motion.  Cardiovascular: Normal rate, regular rhythm and normal heart sounds.   No murmur heard. Pulmonary/Chest: Effort normal and breath sounds normal. He has no wheezes.    Neurological: He  is alert and oriented to person, place, and time. Gait normal.  Skin: Skin is warm and dry.  Psychiatric: Mood, memory, affect and judgment normal.   Dg Ribs Unilateral W/chest Left  11/02/2015  CLINICAL DATA:  Left side rib pain, left thoracic back pain EXAM: LEFT RIBS AND CHEST - 3+ VIEW COMPARISON:  12/27/2014 FINDINGS: Three views left side ribs submitted. No infiltrate or pulmonary edema. No rib fracture is identified. Probable old fracture of the left eighth rib. There is no pneumothorax. IMPRESSION: Negative. Electronically Signed   By: Lahoma Crocker M.D.   On: 11/02/2015 09:08   Assessment and Plan :  Left-sided thoracic back pain - Plan: DG Ribs Unilateral W/Chest Left, cyclobenzaprine (FLEXERIL) 5 MG tablet, meloxicam (MOBIC) 7.5 MG tablet   No rib fractures seen today and with the large generalized area of pain - it is likely this is muscle related - we will treat with muscle relaxers and NSAIDs.  Windell Hummingbird PA-C  [facility] 11/02/2015 9:42 AM

## 2015-11-02 NOTE — Progress Notes (Signed)
Subjective:     Patient ID: Lee [name], male   DOB: 1945/12/21, 70 y.o.   MRN: [id number]  HPI The patient is a 70 year old male with no significant PMH. Two days ago he was working and bent forward to pick up a box hurt his left thoracic back. Heard a loud noise, describes as cracking, and developed pain his his back. Mild pain for the past two days, today the sharp pain increased in severity. No pain with heavy lifting, endorses pain with lateral rotation of trunk. Currently taking advil and tylenol 1 pill each which provides minimal relief. Denies pain with coughing, denies shortness of breath or pain on inspiration. Denies pain radiation to the arms or legs.  Review of Systems  Constitutional: Negative for fever and chills.  Respiratory: Negative for cough and shortness of breath.   Cardiovascular: Negative for chest pain and palpitations.  Gastrointestinal: Negative for abdominal pain.       Positive for nausea yesterday  Genitourinary: Negative for dysuria and frequency.  Neurological: Negative for dizziness and headaches.       Objective:   Physical Exam  Constitutional: He appears well-developed and well-nourished.  HENT:  Head: Normocephalic and atraumatic.  Cardiovascular: Normal rate, regular rhythm, normal heart sounds and intact distal pulses.  Exam reveals no gallop and no friction rub.   No murmur heard. Pulmonary/Chest: Breath sounds normal. No respiratory distress. He has no wheezes. He has no rales. He exhibits tenderness.  Musculoskeletal: He exhibits tenderness. He exhibits no edema.       Arms: Significant pain and guarding with palpation over the left mid axillary 4-6 intercostal margins.   Dg Ribs Unilateral W/chest Left  11/02/2015  CLINICAL DATA:  Left side rib pain, left thoracic back pain EXAM: LEFT RIBS AND CHEST - 3+ VIEW COMPARISON:  12/27/2014 FINDINGS: Three views left side ribs submitted. No infiltrate or pulmonary edema. No rib fracture is  identified. Probable old fracture of the left eighth rib. There is no pneumothorax. IMPRESSION: Negative. Electronically Signed   By: Lahoma Crocker M.D.   On: 11/02/2015 09:08      Assessment:     The patient is a 70 year old relatively healthy male with onset of acute left sided trunk pain when bending two days ago. CXR ordered to r/o rib fractures. No acute fractures noted, will treat symptomatically for left sided trunk pain and encourage patient to follow up if symptoms do not resolve within two weeks    Plan:     1. Left-sided thoracic back pain - DG Ribs Unilateral W/Chest Left; Future - cyclobenzaprine (FLEXERIL) 5 MG tablet; Take 1 tablet (5 mg total) by mouth 3 (three) times daily as needed for muscle spasms.  Dispense: 30 tablet; Refill: 0 - meloxicam (MOBIC) 7.5 MG tablet; Take 1-2 tablets (7.5-15 mg total) by mouth daily.  Dispense: 30 tablet; Refill: 0 - Continue to use heating pad in the area - Rest and take deeps breaths to increase lung volume - Follow up in two weeks if symptoms do not resolve

## 2015-11-02 NOTE — Patient Instructions (Addendum)
Continue warm compresses - it is really important to take deep breaths   IF you received an x-ray today, you will receive an invoice from [facility]. Please contact [facility] at [phone] with questions or concerns regarding your invoice.   IF you received labwork today, you will receive an invoice from [employer]. Please contact [facility] at [phone] with questions or concerns regarding your invoice.   Our billing staff will not be able to assist you with questions regarding bills from these companies.  You will be contacted with the lab results as soon as they are available. The fastest way to get your results is to activate your My Chart account. Instructions are located on the last page of this paperwork. If you have not heard from [place] regarding the results in 2 weeks, please contact this office.

## 2016-04-11 ENCOUNTER — Other Ambulatory Visit: Payer: Self-pay

## 2016-04-13 DIAGNOSIS — Z23 Encounter for immunization: Secondary | ICD-10-CM | POA: Diagnosis not present

## 2016-04-18 ENCOUNTER — Ambulatory Visit (INDEPENDENT_AMBULATORY_CARE_PROVIDER_SITE_OTHER): Payer: Medicare Other | Admitting: Physician Assistant

## 2016-04-18 VITALS — BP 120/56 | HR 64 | Temp 98.1°F | Resp 16 | Ht 60.0 in | Wt 145.6 lb

## 2016-04-18 DIAGNOSIS — M545 Low back pain, unspecified: Secondary | ICD-10-CM

## 2016-04-18 DIAGNOSIS — M546 Pain in thoracic spine: Secondary | ICD-10-CM | POA: Diagnosis not present

## 2016-04-18 MED ORDER — MELOXICAM 7.5 MG PO TABS: 7.5000 mg | ORAL_TABLET | Freq: Every day | ORAL | 0 refills | Status: DC

## 2016-04-18 NOTE — Progress Notes (Signed)
Lee [name]  MRN: [id number] DOB: 02-04-1946  PCP: Jeanita Carneiro Sauer  Subjective:  Pt is a pleasant 70 year old male who presents to clinic for left-sided low back pain. Started this morning. No MOI,  Describes it as tight, does not radiate. Localized to lower left only. Denies saddle paresthesia or loss of bowel movement, numbness, tingling, leg pain.  Has applied a few adhesive pads to his area of pain, they are from his home country, [place] and are used for muscle pains.  He works at a [employer]. 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  He has been here for back pain multiple times. Attended physical therapy a few sessions. He states this helped quite a bit, however he now gets bills $75/month and is not willing to go back due to cost.  Does not do back exercises or stretches at home.   Review of Systems  Respiratory: Negative.   Cardiovascular: Negative.   Musculoskeletal: Positive for back pain (lower left side). Negative for arthralgias, gait problem, neck pain and neck stiffness.  Skin: Negative.   Neurological: Negative for dizziness, weakness, numbness and headaches.    There are no active problems to display for this patient.   Current Outpatient Prescriptions on File Prior to Visit  Medication Sig Dispense Refill  . pyridOXINE (VITAMIN B-6) 100 MG tablet Take 100 mg by mouth daily. Reported on 11/02/2015    . cyclobenzaprine (FLEXERIL) 5 MG tablet Take 1 tablet (5 mg total) by mouth 3 (three) times daily as needed for muscle spasms. (Patient not taking: Reported on 04/18/2016) 30 tablet 0  . ibuprofen (ADVIL,MOTRIN) 600 MG tablet Take 1 tablet (600 mg total) by mouth every 8 (eight) hours as needed. (Patient not taking: Reported on 11/02/2015) 30 tablet 0  . meloxicam (MOBIC) 7.5 MG tablet Take 1-2 tablets (7.5-15 mg total) by mouth daily. (Patient not taking: Reported on 04/18/2016) 30 tablet 0   No current  facility-administered medications on file prior to visit.     No Known Allergies  Objective:  BP (!) 120/56 (BP Location: Right Arm, Patient Position: Sitting, Cuff Size: Normal)   Pulse 64   Temp 98.1 F (36.7 C) (Oral)   Resp 16   Ht 5' (1.524 m)   Wt 145 lb 9.6 oz (66 kg)   SpO2 99%   BMI 28.44 kg/m   Physical Exam  Constitutional: He is oriented to person, place, and time and well-developed, well-nourished, and in no distress. No distress.  Cardiovascular: Normal rate, regular rhythm and normal heart sounds.   Pulmonary/Chest: Effort normal. No respiratory distress.  Musculoskeletal:       Arms: Severe kyphosis upper torso.  Neurological: He is alert and oriented to person, place, and time. GCS score is 15.  Skin: Skin is warm and dry.  Psychiatric: Mood, memory, affect and judgment normal.  Vitals reviewed.   Assessment and Plan :  1. Left-sided low back pain without sciatica - meloxicam (MOBIC) 7.5 MG tablet; Take 1-2 tablets (7.5-15 mg total) by mouth daily.  Dispense: 30 tablet; Refill: 0 - Discussed with patient use of heat and the importance of back exercises and stretches. Printed off low back stretches for patient and demonstrated a few. He understands and says he will try to exercise more. 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    Whitney Lumi Winslett, PA-C  [facility] 04/18/2016 3:03 PM

## 2016-04-18 NOTE — Patient Instructions (Addendum)
Apply ice or heat to the affected area for 15 minutes at a time, a few times a day. Be careful to not lift and twist, to avoid further injuring the area.  Please keep moving and stretch using the following pictures.    IF you received an x-ray today, you will receive an invoice from [facility]. Please contact [facility] at [phone] with questions or concerns regarding your invoice.   IF you received labwork today, you will receive an invoice from [employer]. Please contact [facility] at [phone] with questions or concerns regarding your invoice.   Our billing staff will not be able to assist you with questions regarding bills from these companies.  You will be contacted with the lab results as soon as they are available. The fastest way to get your results is to activate your My Chart account. Instructions are located on the last page of this paperwork. If you have not heard from [place] regarding the results in 2 weeks, please contact this office.     Low Back Strain With Rehab A strain is an injury in which a tendon or muscle is torn. The muscles and tendons of the lower back are vulnerable to strains. However, these muscles and tendons are very strong and require a great force to be injured. Strains are classified into three categories. Grade 1 strains cause pain, but the tendon is not lengthened. Grade 2 strains include a lengthened ligament, due to the ligament being stretched or partially ruptured. With grade 2 strains there is still function, although the function may be decreased. Grade 3 strains involve a complete tear of the tendon or muscle, and function is usually impaired. SYMPTOMS   Pain in the lower back.  Pain that affects one side more than the other.  Pain that gets worse with movement and may be felt in the hip, buttocks, or back of the thigh.  Muscle spasms of the muscles in the back.  Swelling along the muscles of the  back.  Loss of strength of the back muscles.  Crackling sound (crepitation) when the muscles are touched. CAUSES  Lower back strains occur when a force is placed on the muscles or tendons that is greater than they can handle. Common causes of injury include:  Prolonged overuse of the muscle-tendon units in the lower back, usually from incorrect posture.  A single violent injury or force applied to the back. RISK INCREASES WITH:  Sports that involve twisting forces on the spine or a lot of bending at the waist (football, rugby, weightlifting, bowling, golf, tennis, speed skating, racquetball, swimming, running, gymnastics, diving).  Poor strength and flexibility.  Failure to warm up properly before activity.  Family history of lower back pain or disk disorders.  Previous back injury or surgery (especially fusion).  Poor posture with lifting, especially heavy objects.  Prolonged sitting, especially with poor posture. PREVENTION   Learn and use proper posture when sitting or lifting (maintain proper posture when sitting, lift using the knees and legs, not at the waist).  Warm up and stretch properly before activity.  Allow for adequate recovery between workouts.  Maintain physical fitness:  Strength, flexibility, and endurance.  Cardiovascular fitness. PROGNOSIS  If treated properly, lower back strains usually heal within 6 weeks. RELATED COMPLICATIONS   Recurring symptoms, resulting in a chronic problem.  Chronic inflammation, scarring, and partial muscle-tendon tear.  Delayed healing or resolution of symptoms.  Prolonged disability. TREATMENT  Treatment first involves the use of  ice and medicine, to reduce pain and inflammation. The use of strengthening and stretching exercises may help reduce pain with activity. These exercises may be performed at home or with a therapist. Severe injuries may require referral to a therapist for further evaluation and treatment, such  as ultrasound. Your caregiver may advise that you wear a back brace or corset, to help reduce pain and discomfort. Often, prolonged bed rest results in greater harm then benefit. Corticosteroid injections may be recommended. However, these should be reserved for the most serious cases. It is important to avoid using your back when lifting objects. At night, sleep on your back on a firm mattress with a pillow placed under your knees. If non-surgical treatment is unsuccessful, surgery may be needed.  MEDICATION   If pain medicine is needed, nonsteroidal anti-inflammatory medicines (aspirin and ibuprofen), or other minor pain relievers (acetaminophen), are often advised.  Do not take pain medicine for 7 days before surgery.  Prescription pain relievers may be given, if your caregiver thinks they are needed. Use only as directed and only as much as you need.  Ointments applied to the skin may be helpful.  Corticosteroid injections may be given by your caregiver. These injections should be reserved for the most serious cases, because they may only be given a certain number of times. HEAT AND COLD  Cold treatment (icing) should be applied for 10 to 15 minutes every 2 to 3 hours for inflammation and pain, and immediately after activity that aggravates your symptoms. Use ice packs or an ice massage.  Heat treatment may be used before performing stretching and strengthening activities prescribed by your caregiver, physical therapist, or athletic trainer. Use a heat pack or a warm water soak. SEEK MEDICAL CARE IF:   Symptoms get worse or do not improve in 2 to 4 weeks, despite treatment.  You develop numbness, weakness, or loss of bowel or bladder function.  New, unexplained symptoms develop. (Drugs used in treatment may produce side effects.) EXERCISES  RANGE OF MOTION (ROM) AND STRETCHING EXERCISES - Low Back Strain Most people with lower back pain will find that their symptoms get worse with  excessive bending forward (flexion) or arching at the lower back (extension). The exercises which will help resolve your symptoms will focus on the opposite motion.  Your physician, physical therapist or athletic trainer will help you determine which exercises will be most helpful to resolve your lower back pain. Do not complete any exercises without first consulting with your caregiver. Discontinue any exercises which make your symptoms worse until you speak to your caregiver.  If you have pain, numbness or tingling which travels down into your buttocks, leg or foot, the goal of the therapy is for these symptoms to move closer to your back and eventually resolve. Sometimes, these leg symptoms will get better, but your lower back pain may worsen. This is typically an indication of progress in your rehabilitation. Be very alert to any changes in your symptoms and the activities in which you participated in the 24 hours prior to the change. Sharing this information with your caregiver will allow him/her to most efficiently treat your condition.  These exercises may help you when beginning to rehabilitate your injury. Your symptoms may resolve with or without further involvement from your physician, physical therapist or athletic trainer. While completing these exercises, remember:  Restoring tissue flexibility helps normal motion to return to the joints. This allows healthier, less painful movement and activity.  An effective stretch  should be held for at least 30 seconds.  A stretch should never be painful. You should only feel a gentle lengthening or release in the stretched tissue. FLEXION RANGE OF MOTION AND STRETCHING EXERCISES: STRETCH - Flexion, Single Knee to Chest   Lie on a firm bed or floor with both legs extended in front of you.  Keeping one leg in contact with the floor, bring your opposite knee to your chest. Hold your leg in place by either grabbing behind your thigh or at your  knee.  Pull until you feel a gentle stretch in your lower back. Hold __________ seconds.  Slowly release your grasp and repeat the exercise with the opposite side. Repeat __________ times. Complete this exercise __________ times per day.  STRETCH - Flexion, Double Knee to Chest   Lie on a firm bed or floor with both legs extended in front of you.  Keeping one leg in contact with the floor, bring your opposite knee to your chest.  Tense your stomach muscles to support your back and then lift your other knee to your chest. Hold your legs in place by either grabbing behind your thighs or at your knees.  Pull both knees toward your chest until you feel a gentle stretch in your lower back. Hold __________ seconds.  Tense your stomach muscles and slowly return one leg at a time to the floor. Repeat __________ times. Complete this exercise __________ times per day.  STRETCH - Low Trunk Rotation  Lie on a firm bed or floor. Keeping your legs in front of you, bend your knees so they are both pointed toward the ceiling and your feet are flat on the floor.  Extend your arms out to the side. This will stabilize your upper body by keeping your shoulders in contact with the floor.  Gently and slowly drop both knees together to one side until you feel a gentle stretch in your lower back. Hold for __________ seconds.  Tense your stomach muscles to support your lower back as you bring your knees back to the starting position. Repeat the exercise to the other side. Repeat __________ times. Complete this exercise __________ times per day  EXTENSION RANGE OF MOTION AND FLEXIBILITY EXERCISES: STRETCH - Extension, Prone on Elbows   Lie on your stomach on the floor, a bed will be too soft. Place your palms about shoulder width apart and at the height of your head.  Place your elbows under your shoulders. If this is too painful, stack pillows under your chest.  Allow your body to relax so that your hips  drop lower and make contact more completely with the floor.  Hold this position for __________ seconds.  Slowly return to lying flat on the floor. Repeat __________ times. Complete this exercise __________ times per day.  RANGE OF MOTION - Extension, Prone Press Ups  Lie on your stomach on the floor, a bed will be too soft. Place your palms about shoulder width apart and at the height of your head.  Keeping your back as relaxed as possible, slowly straighten your elbows while keeping your hips on the floor. You may adjust the placement of your hands to maximize your comfort. As you gain motion, your hands will come more underneath your shoulders.  Hold this position __________ seconds.  Slowly return to lying flat on the floor. Repeat __________ times. Complete this exercise __________ times per day.  RANGE OF MOTION- Quadruped, Neutral Spine   Assume a hands and knees  position on a firm surface. Keep your hands under your shoulders and your knees under your hips. You may place padding under your knees for comfort.  Drop your head and point your tail bone toward the ground below you. This will round out your lower back like an angry cat. Hold this position for __________ seconds.  Slowly lift your head and release your tail bone so that your back sags into a large arch, like an old horse.  Hold this position for __________ seconds.  Repeat this until you feel limber in your lower back.  Now, find your "sweet spot." This will be the most comfortable position somewhere between the two previous positions. This is your neutral spine. Once you have found this position, tense your stomach muscles to support your lower back.  Hold this position for __________ seconds. Repeat __________ times. Complete this exercise __________ times per day.  STRENGTHENING EXERCISES - Low Back Strain These exercises may help you when beginning to rehabilitate your injury. These exercises should be done near  your "sweet spot." This is the neutral, low-back arch, somewhere between fully rounded and fully arched, that is your least painful position. When performed in this safe range of motion, these exercises can be used for people who have either a flexion or extension based injury. These exercises may resolve your symptoms with or without further involvement from your physician, physical therapist or athletic trainer. While completing these exercises, remember:   Muscles can gain both the endurance and the strength needed for everyday activities through controlled exercises.  Complete these exercises as instructed by your physician, physical therapist or athletic trainer. Increase the resistance and repetitions only as guided.  You may experience muscle soreness or fatigue, but the pain or discomfort you are trying to eliminate should never worsen during these exercises. If this pain does worsen, stop and make certain you are following the directions exactly. If the pain is still present after adjustments, discontinue the exercise until you can discuss the trouble with your caregiver. STRENGTHENING - Deep Abdominals, Pelvic Tilt  Lie on a firm bed or floor. Keeping your legs in front of you, bend your knees so they are both pointed toward the ceiling and your feet are flat on the floor.  Tense your lower abdominal muscles to press your lower back into the floor. This motion will rotate your pelvis so that your tail bone is scooping upwards rather than pointing at your feet or into the floor.  With a gentle tension and even breathing, hold this position for __________ seconds. Repeat __________ times. Complete this exercise __________ times per day.  STRENGTHENING - Abdominals, Crunches   Lie on a firm bed or floor. Keeping your legs in front of you, bend your knees so they are both pointed toward the ceiling and your feet are flat on the floor. Cross your arms over your chest.  Slightly tip your chin  down without bending your neck.  Tense your abdominals and slowly lift your trunk high enough to just clear your shoulder blades. Lifting higher can put excessive stress on the lower back and does not further strengthen your abdominal muscles.  Control your return to the starting position. Repeat __________ times. Complete this exercise __________ times per day.  STRENGTHENING - Quadruped, Opposite UE/LE Lift   Assume a hands and knees position on a firm surface. Keep your hands under your shoulders and your knees under your hips. You may place padding under your knees for comfort.  Find your neutral spine and gently tense your abdominal muscles so that you can maintain this position. Your shoulders and hips should form a rectangle that is parallel with the floor and is not twisted.  Keeping your trunk steady, lift your right hand no higher than your shoulder and then your left leg no higher than your hip. Make sure you are not holding your breath. Hold this position __________ seconds.  Continuing to keep your abdominal muscles tense and your back steady, slowly return to your starting position. Repeat with the opposite arm and leg. Repeat __________ times. Complete this exercise __________ times per day.  STRENGTHENING - Lower Abdominals, Double Knee Lift  Lie on a firm bed or floor. Keeping your legs in front of you, bend your knees so they are both pointed toward the ceiling and your feet are flat on the floor.  Tense your abdominal muscles to brace your lower back and slowly lift both of your knees until they come over your hips. Be certain not to hold your breath.  Hold __________ seconds. Using your abdominal muscles, return to the starting position in a slow and controlled manner. Repeat __________ times. Complete this exercise __________ times per day.  POSTURE AND BODY MECHANICS CONSIDERATIONS - Low Back Strain Keeping correct posture when sitting, standing or completing your  activities will reduce the stress put on different body tissues, allowing injured tissues a chance to heal and limiting painful experiences. The following are general guidelines for improved posture. Your physician or physical therapist will provide you with any instructions specific to your needs. While reading these guidelines, remember:  The exercises prescribed by your provider will help you have the flexibility and strength to maintain correct postures.  The correct posture provides the best environment for your joints to work. All of your joints have less wear and tear when properly supported by a spine with good posture. This means you will experience a healthier, less painful body.  Correct posture must be practiced with all of your activities, especially prolonged sitting and standing. Correct posture is as important when doing repetitive low-stress activities (typing) as it is when doing a single heavy-load activity (lifting). RESTING POSITIONS Consider which positions are most painful for you when choosing a resting position. If you have pain with flexion-based activities (sitting, bending, stooping, squatting), choose a position that allows you to rest in a less flexed posture. You would want to avoid curling into a fetal position on your side. If your pain worsens with extension-based activities (prolonged standing, working overhead), avoid resting in an extended position such as sleeping on your stomach. Most people will find more comfort when they rest with their spine in a more neutral position, neither too rounded nor too arched. Lying on a non-sagging bed on your side with a pillow between your knees, or on your back with a pillow under your knees will often provide some relief. Keep in mind, being in any one position for a prolonged period of time, no matter how correct your posture, can still lead to stiffness. PROPER SITTING POSTURE In order to minimize stress and discomfort on your  spine, you must sit with correct posture. Sitting with good posture should be effortless for a healthy body. Returning to good posture is a gradual process. Many people can work toward this most comfortably by using various supports until they have the flexibility and strength to maintain this posture on their own. When sitting with proper posture, your ears will fall  over your shoulders and your shoulders will fall over your hips. You should use the back of the chair to support your upper back. Your lower back will be in a neutral position, just slightly arched. You may place a small pillow or folded towel at the base of your lower back for support.  When working at a desk, create an environment that supports good, upright posture. Without extra support, muscles tire, which leads to excessive strain on joints and other tissues. Keep these recommendations in mind: CHAIR:  A chair should be able to slide under your desk when your back makes contact with the back of the chair. This allows you to work closely.  The chair's height should allow your eyes to be level with the upper part of your monitor and your hands to be slightly lower than your elbows. BODY POSITION  Your feet should make contact with the floor. If this is not possible, use a foot rest.  Keep your ears over your shoulders. This will reduce stress on your neck and lower back. INCORRECT SITTING POSTURES  If you are feeling tired and unable to assume a healthy sitting posture, do not slouch or slump. This puts excessive strain on your back tissues, causing more damage and pain. Healthier options include:  Using more support, like a lumbar pillow.  Switching tasks to something that requires you to be upright or walking.  Talking a brief walk.  Lying down to rest in a neutral-spine position. PROLONGED STANDING WHILE SLIGHTLY LEANING FORWARD  When completing a task that requires you to lean forward while standing in one place for a  long time, place either foot up on a stationary 2-4 inch high object to help maintain the best posture. When both feet are on the ground, the lower back tends to lose its slight inward curve. If this curve flattens (or becomes too large), then the back and your other joints will experience too much stress, tire more quickly, and can cause pain. CORRECT STANDING POSTURES Proper standing posture should be assumed with all daily activities, even if they only take a few moments, like when brushing your teeth. As in sitting, your ears should fall over your shoulders and your shoulders should fall over your hips. You should keep a slight tension in your abdominal muscles to brace your spine. Your tailbone should point down to the ground, not behind your body, resulting in an over-extended swayback posture.  INCORRECT STANDING POSTURES  Common incorrect standing postures include a forward head, locked knees and/or an excessive swayback. WALKING Walk with an upright posture. Your ears, shoulders and hips should all line-up. PROLONGED ACTIVITY IN A FLEXED POSITION When completing a task that requires you to bend forward at your waist or lean over a low surface, try to find a way to stabilize 3 out of 4 of your limbs. You can place a hand or elbow on your thigh or rest a knee on the surface you are reaching across. This will provide you more stability so that your muscles do not fatigue as quickly. By keeping your knees relaxed, or slightly bent, you will also reduce stress across your lower back. CORRECT LIFTING TECHNIQUES DO :   Assume a wide stance. This will provide you more stability and the opportunity to get as close as possible to the object which you are lifting.  Tense your abdominals to brace your spine. Bend at the knees and hips. Keeping your back locked in a neutral-spine position, lift  using your leg muscles. Lift with your legs, keeping your back straight.  Test the weight of unknown objects  before attempting to lift them.  Try to keep your elbows locked down at your sides in order get the best strength from your shoulders when carrying an object.  Always ask for help when lifting heavy or awkward objects. INCORRECT LIFTING TECHNIQUES DO NOT:   Lock your knees when lifting, even if it is a small object.  Bend and twist. Pivot at your feet or move your feet when needing to change directions.  Assume that you can safely pick up even a paper clip without proper posture.   This information is not intended to replace advice given to you by your health care provider. Make sure you discuss any questions you have with your health care provider.   Document Released: 07/24/2005 Document Revised: 08/14/2014 Document Reviewed: 11/05/2008 Elsevier Interactive Patient Education [employer].

## 2016-07-11 ENCOUNTER — Emergency Department (HOSPITAL_COMMUNITY): Payer: Medicare Other

## 2016-07-11 ENCOUNTER — Ambulatory Visit (INDEPENDENT_AMBULATORY_CARE_PROVIDER_SITE_OTHER): Payer: Medicare Other | Admitting: Family Medicine

## 2016-07-11 ENCOUNTER — Encounter (HOSPITAL_COMMUNITY): Payer: Self-pay | Admitting: Emergency Medicine

## 2016-07-11 ENCOUNTER — Inpatient Hospital Stay (HOSPITAL_COMMUNITY)
Admission: EM | Admit: 2016-07-11 | Discharge: 2016-07-13 | DRG: 603 | Disposition: A | Discharge status: Home / Self Care | Payer: Medicare Other | Attending: Internal Medicine | Admitting: Internal Medicine

## 2016-07-11 VITALS — BP 110/58 | HR 79 | Temp 99.5°F | Resp 16 | Ht 60.0 in | Wt 150.0 lb

## 2016-07-11 DIAGNOSIS — S81801A Unspecified open wound, right lower leg, initial encounter: Secondary | ICD-10-CM | POA: Diagnosis present

## 2016-07-11 DIAGNOSIS — W2203XA Walked into furniture, initial encounter: Secondary | ICD-10-CM | POA: Diagnosis present

## 2016-07-11 DIAGNOSIS — D649 Anemia, unspecified: Secondary | ICD-10-CM | POA: Diagnosis not present

## 2016-07-11 DIAGNOSIS — L03116 Cellulitis of left lower limb: Secondary | ICD-10-CM

## 2016-07-11 DIAGNOSIS — L089 Local infection of the skin and subcutaneous tissue, unspecified: Secondary | ICD-10-CM | POA: Diagnosis not present

## 2016-07-11 DIAGNOSIS — M25572 Pain in left ankle and joints of left foot: Secondary | ICD-10-CM | POA: Diagnosis not present

## 2016-07-11 DIAGNOSIS — Z87442 Personal history of urinary calculi: Secondary | ICD-10-CM | POA: Diagnosis not present

## 2016-07-11 DIAGNOSIS — M7989 Other specified soft tissue disorders: Secondary | ICD-10-CM | POA: Diagnosis not present

## 2016-07-11 DIAGNOSIS — D72829 Elevated white blood cell count, unspecified: Secondary | ICD-10-CM | POA: Diagnosis not present

## 2016-07-11 DIAGNOSIS — L0390 Cellulitis, unspecified: Secondary | ICD-10-CM | POA: Diagnosis present

## 2016-07-11 DIAGNOSIS — R739 Hyperglycemia, unspecified: Secondary | ICD-10-CM | POA: Diagnosis not present

## 2016-07-11 DIAGNOSIS — I872 Venous insufficiency (chronic) (peripheral): Secondary | ICD-10-CM | POA: Diagnosis not present

## 2016-07-11 LAB — CBC WITH DIFFERENTIAL/PLATELET
Basophils Absolute: 0 10*3/uL (ref 0.0–0.1)
Basophils Relative: 0 %
EOS ABS: 0 10*3/uL (ref 0.0–0.7)
Eosinophils Relative: 0 %
HCT: 37 % — ABNORMAL LOW (ref 39.0–52.0)
HEMOGLOBIN: 12.6 g/dL — AB (ref 13.0–17.0)
LYMPHS ABS: 1.3 10*3/uL (ref 0.7–4.0)
LYMPHS PCT: 11 %
MCH: 31.7 pg (ref 26.0–34.0)
MCHC: 34.1 g/dL (ref 30.0–36.0)
MCV: 93.2 fL (ref 78.0–100.0)
Monocytes Absolute: 0.7 10*3/uL (ref 0.1–1.0)
Monocytes Relative: 6 %
NEUTROS PCT: 83 %
Neutro Abs: 9.7 10*3/uL — ABNORMAL HIGH (ref 1.7–7.7)
Platelets: 249 10*3/uL (ref 150–400)
RBC: 3.97 MIL/uL — AB (ref 4.22–5.81)
RDW: 13.1 % (ref 11.5–15.5)
WBC: 11.8 10*3/uL — AB (ref 4.0–10.5)

## 2016-07-11 LAB — COMPREHENSIVE METABOLIC PANEL
ALT: 29 U/L (ref 17–63)
ANION GAP: 7 (ref 5–15)
AST: 27 U/L (ref 15–41)
Albumin: 4.2 g/dL (ref 3.5–5.0)
Alkaline Phosphatase: 43 U/L (ref 38–126)
BUN: 21 mg/dL — ABNORMAL HIGH (ref 6–20)
CHLORIDE: 102 mmol/L (ref 101–111)
CO2: 25 mmol/L (ref 22–32)
Calcium: 8.8 mg/dL — ABNORMAL LOW (ref 8.9–10.3)
Creatinine, Ser: 0.66 mg/dL (ref 0.61–1.24)
Glucose, Bld: 111 mg/dL — ABNORMAL HIGH (ref 65–99)
POTASSIUM: 3.7 mmol/L (ref 3.5–5.1)
Sodium: 134 mmol/L — ABNORMAL LOW (ref 135–145)
Total Bilirubin: 1.1 mg/dL (ref 0.3–1.2)
Total Protein: 7.2 g/dL (ref 6.5–8.1)

## 2016-07-11 LAB — URINALYSIS, ROUTINE W REFLEX MICROSCOPIC
BILIRUBIN URINE: NEGATIVE
Bacteria, UA: NONE SEEN
Glucose, UA: NEGATIVE mg/dL
KETONES UR: 5 mg/dL — AB
LEUKOCYTES UA: NEGATIVE
NITRITE: NEGATIVE
PH: 6 (ref 5.0–8.0)
Protein, ur: NEGATIVE mg/dL
RBC / HPF: NONE SEEN RBC/hpf (ref 0–5)
SQUAMOUS EPITHELIAL / LPF: NONE SEEN
Specific Gravity, Urine: 1.009 (ref 1.005–1.030)
WBC, UA: NONE SEEN WBC/hpf (ref 0–5)

## 2016-07-11 LAB — POCT CBC
Granulocyte percent: 87.5 %G — AB (ref 37–80)
HCT, POC: 33.4 % — AB (ref 43.5–53.7)
Hemoglobin: 12 g/dL — AB (ref 14.1–18.1)
Lymph, poc: 1 (ref 0.6–3.4)
MCH: 32.8 pg — AB (ref 27–31.2)
MCHC: 35.8 g/dL — AB (ref 31.8–35.4)
MCV: 91.6 fL (ref 80–97)
MID (CBC): 0.4 (ref 0–0.9)
MPV: 6.8 fL (ref 0–99.8)
PLATELET COUNT, POC: 218 10*3/uL (ref 142–424)
POC Granulocyte: 10.2 — AB (ref 2–6.9)
POC LYMPH %: 9 % — AB (ref 10–50)
POC MID %: 3.5 % (ref 0–12)
RBC: 3.64 M/uL — AB (ref 4.69–6.13)
RDW, POC: 12.8 %
WBC: 11.6 10*3/uL — AB (ref 4.6–10.2)

## 2016-07-11 LAB — TROPONIN I

## 2016-07-11 LAB — I-STAT CG4 LACTIC ACID, ED: Lactic Acid, Venous: 0.87 mmol/L (ref 0.5–1.9)

## 2016-07-11 MED ORDER — VANCOMYCIN HCL IN DEXTROSE 750-5 MG/150ML-% IV SOLN
750.0000 mg | Freq: Two times a day (BID) | INTRAVENOUS | Status: DC
  Administered 2016-07-11 – 2016-07-13 (×4): 750 mg via INTRAVENOUS
  Filled 2016-07-11 (×4): qty 150

## 2016-07-11 MED ORDER — ENOXAPARIN SODIUM 40 MG/0.4ML ~~LOC~~ SOLN
40.0000 mg | SUBCUTANEOUS | Status: DC
Start: 2016-07-11 — End: 2016-07-13
  Administered 2016-07-11 – 2016-07-12 (×2): 40 mg via SUBCUTANEOUS
  Filled 2016-07-11 (×2): qty 0.4

## 2016-07-11 MED ORDER — ACETAMINOPHEN 650 MG RE SUPP: 650.0000 mg | Freq: Four times a day (QID) | RECTAL | Status: DC | PRN

## 2016-07-11 MED ORDER — ACETAMINOPHEN 325 MG PO TABS
650.0000 mg | ORAL_TABLET | Freq: Four times a day (QID) | ORAL | Status: DC | PRN
  Administered 2016-07-12: 650 mg via ORAL
  Filled 2016-07-11: qty 2

## 2016-07-11 MED ORDER — VANCOMYCIN HCL IN DEXTROSE 1-5 GM/200ML-% IV SOLN
1000.0000 mg | Freq: Once | INTRAVENOUS | Status: AC
  Administered 2016-07-11: 1000 mg via INTRAVENOUS
  Filled 2016-07-11: qty 200

## 2016-07-11 MED ORDER — SODIUM CHLORIDE 0.9 % IV BOLUS (SEPSIS)
1000.0000 mL | Freq: Once | INTRAVENOUS | Status: AC
  Administered 2016-07-11: 1000 mL via INTRAVENOUS

## 2016-07-11 NOTE — Patient Instructions (Addendum)
Please follow up with [facility] for evaluation of wound    IF you received an x-ray today, you will receive an invoice from [facility]. Please contact [facility] at [phone] with questions or concerns regarding your invoice.   IF you received labwork today, you will receive an invoice from [employer]. Please contact [facility] at [phone] with questions or concerns regarding your invoice.   Our billing staff will not be able to assist you with questions regarding bills from these companies.  You will be contacted with the lab results as soon as they are available. The fastest way to get your results is to activate your My Chart account. Instructions are located on the last page of this paperwork. If you have not heard from [place] regarding the results in 2 weeks, please contact this office.

## 2016-07-11 NOTE — ED Provider Notes (Signed)
[facility] DEPT Provider Note   CSN: [id number] Arrival date & time: 07/11/16  1005     History   Chief Complaint Chief Complaint  Patient presents with  . Leg Swelling  . Wound Check    HPI Lee [name] is a 70 y.o. male.  Pt presents to the ED today with leg swelling.  Pt is previously healthy and does not take any regular medications.  The pt speaks primarily [place], so most of the history is obtained via his wife.  Pt said that he hit his left lower leg on something at work which caused a wound.  The wound has never healed.  Pt has not told his family about this until today.  Pt initially went to urgent care who sent him here.  The pt denies f/c.       Past Medical History:  Diagnosis Date  . Kidney stones     Patient Active Problem List   Diagnosis Date Noted  . Cellulitis 07/11/2016    History reviewed. No pertinent surgical history.     Home Medications    Prior to Admission medications   Medication Sig Start Date End Date Taking? Authorizing Provider  acetaminophen (TYLENOL) 325 MG tablet Take 650 mg by mouth every 6 (six) hours as needed for moderate pain.   Yes Historical Provider, MD  Pseudoephedrine-APAP-DM (TYLENOL COLD/FLU SEVERE DAY PO) Take 1 capsule by mouth every 6 (six) hours as needed (cold symptoms).   Yes Historical Provider, MD  pyridOXINE (VITAMIN B-6) 100 MG tablet Take 100 mg by mouth daily. Reported on 11/02/2015   Yes Historical Provider, MD    Family History Family History  Problem Relation Age of Onset  . Stomach cancer Father     ?  . Colon cancer Neg Hx     Social History Social History  Substance Use Topics  . Smoking status: Never Smoker  . Smokeless tobacco: Never Used  . Alcohol use No     Allergies   Patient has no known allergies.   Review of Systems Review of Systems  Skin: Positive for rash and wound.  All other systems reviewed and are negative.    Physical Exam Updated Vital Signs BP  108/58   Pulse 74   Temp 98.3 F (36.8 C) (Oral)   Resp 19   Ht 5' (1.524 m)   Wt 150 lb (68 kg)   SpO2 96%   BMI 29.29 kg/m   Physical Exam  Constitutional: He is oriented to person, place, and time. He appears well-developed and well-nourished.  HENT:  Head: Normocephalic and atraumatic.  Right Ear: External ear normal.  Left Ear: External ear normal.  Nose: Nose normal.  Mouth/Throat: Oropharynx is clear and moist.  Eyes: Conjunctivae and EOM are normal. Pupils are equal, round, and reactive to light.  Neck: Normal range of motion. Neck supple.  Cardiovascular: Normal rate, regular rhythm, normal heart sounds and intact distal pulses.   Pulmonary/Chest: Effort normal and breath sounds normal.  Abdominal: Soft. Bowel sounds are normal.  Musculoskeletal: Normal range of motion.  Neurological: He is alert and oriented to person, place, and time.  Skin: Skin is warm and dry.  Nonhealing wound to inner left ankle with surrounding cellulitis and swelling.  Lymphangitis going up left leg.  Right lower extremity with chronic skin changes.  Nursing note and vitals reviewed.    ED Treatments / Results  Labs (all labs ordered are listed, but only abnormal results are displayed) Labs Reviewed  COMPREHENSIVE METABOLIC PANEL - Abnormal; Notable for the following:       Result Value   Sodium 134 (*)    Glucose, Bld 111 (*)    BUN 21 (*)    Calcium 8.8 (*)    All other components within normal limits  CBC WITH DIFFERENTIAL/PLATELET - Abnormal; Notable for the following:    WBC 11.8 (*)    RBC 3.97 (*)    Hemoglobin 12.6 (*)    HCT 37.0 (*)    Neutro Abs 9.7 (*)    All other components within normal limits  URINALYSIS, ROUTINE W REFLEX MICROSCOPIC - Abnormal; Notable for the following:    Color, Urine STRAW (*)    Hgb urine dipstick SMALL (*)    Ketones, ur 5 (*)    All other components within normal limits  CULTURE, BLOOD (ROUTINE X 2)  CULTURE, BLOOD (ROUTINE X 2)    TROPONIN I  I-STAT CG4 LACTIC ACID, ED  I-STAT CG4 LACTIC ACID, ED    EKG  EKG Interpretation  Date/Time:  Tuesday July 11 2016 11:43:32 EST Ventricular Rate:  71 PR Interval:    QRS Duration: 90 QT Interval:  402 QTC Calculation: 437 R Axis:   61 Text Interpretation:  Sinus rhythm Repol abnrm suggests ischemia, inferior leads Minimal ST elevation, lateral leads No old tracing to compare Confirmed by [facility] MD, Juleen Sorrels ([id number]) on 07/11/2016 11:55:08 AM       Radiology Dg Ankle Complete Left  Result Date: 07/11/2016 CLINICAL DATA:  Wound of the right lower leg starting 2 days ago with redness and swelling. EXAM: LEFT ANKLE COMPLETE - 3+ VIEW COMPARISON:  None. FINDINGS: There is no evidence of fracture, dislocation, or joint effusion. There is no evidence of arthropathy or other focal bone abnormality. Soft tissue swelling is noted around the ankle. IMPRESSION: Soft tissue swelling around the ankle. There is no evidence of osteomyelitis. Electronically Signed   By: Abelardo Diesel M.D.   On: 07/11/2016 12:33    Procedures Procedures (including critical care time)  Medications Ordered in ED Medications  vancomycin (VANCOCIN) IVPB 750 mg/150 ml premix (not administered)  sodium chloride 0.9 % bolus 1,000 mL (0 mLs Intravenous Stopped 07/11/16 1343)  vancomycin (VANCOCIN) IVPB 1000 mg/200 mL premix (0 mg Intravenous Stopped 07/11/16 1343)     Initial Impression / Assessment and Plan / ED Course  I have reviewed the triage vital signs and the nursing notes.  Pertinent labs & imaging results that were available during my care of the patient were reviewed by me and considered in my medical decision making (see chart for details).  Clinical Course    [place] interpreter via [facility] explained results and all questions answered.  With large, nonhealing wound and cellulitis and lymphangitis, I think pt needs admission for IV abx and possibly studies on blood flow to legs.  Pt  d/w Dr. Lonny Prude for admission.   Final Clinical Impressions(s) / ED Diagnoses   Final diagnoses:  Cellulitis of left lower extremity    New Prescriptions New Prescriptions   No medications on file     Isla Pence, MD 07/11/16 1424

## 2016-07-11 NOTE — Progress Notes (Signed)
  Chief Complaint  Patient presents with  . Leg Swelling    Left onset 2 days    HPI   Pt reports that he has been having a wound at the medial aspect of his left ankle over the malleolus that was draining pus and now is very erythematous, swollen and with pain. He reports onset for 2-3 weeks He had a small wound and covered it with a bandage He has a history of chronic lower extremity edema.  He currently rates his pain as 8/10 and has been having a sharp and pinching tight pain in the left foot  He denies history of diabetes He does not have any medical conditions  He denies scratching the area  Past Medical History:  Diagnosis Date  . Kidney stones     Current Outpatient Prescriptions  Medication Sig Dispense Refill  . pyridOXINE (VITAMIN B-6) 100 MG tablet Take 100 mg by mouth daily. Reported on 11/02/2015     No current facility-administered medications for this visit.     Allergies: No Known Allergies  No past surgical history on file.  Social History   Social History  . Marital status: Unknown    Spouse name: N/A  . Number of children: 2  . Years of education: N/A   Occupational History  . resturant    Social History Main Topics  . Smoking status: Never Smoker  . Smokeless tobacco: Never Used  . Alcohol use No  . Drug use: No  . Sexual activity: Not Asked   Other Topics Concern  . None   Social History Narrative  . None    ROS  Objective: Vitals:   07/11/16 0812  BP: (!) 110/58  Pulse: 79  Resp: 16  Temp: 99.5 F (37.5 C)  TempSrc: Oral  SpO2: 97%  Weight: 150 lb (68 kg)  Height: 5' (1.524 m)    Physical Exam  Constitutional: He is oriented to person, place, and time. He appears well-developed and well-nourished.  HENT:  Head: Normocephalic and atraumatic.  Eyes: Conjunctivae and EOM are normal.  Neck: Normal range of motion. Neck supple.  Cardiovascular: Normal rate, regular rhythm, normal heart sounds and intact distal pulses.    No murmur heard. Pulmonary/Chest: Effort normal and breath sounds normal. No respiratory distress.  Musculoskeletal:       Feet:  Area of erythema involving the calf and is warm in the calf but noted to get hyperpigmented towards the ankle  Neurological: He is alert and oriented to person, place, and time.  Skin: Skin is warm. Capillary refill takes less than 2 seconds.  Psychiatric: He has a normal mood and affect. His behavior is normal. Judgment and thought content normal.    Assessment and Plan Ayan was seen today for leg swelling.  Diagnoses and all orders for this visit:  Soft tissue infection Leukocytosis  Cellulitis - concerning for gangrene or osteo Pt is afebrile and vitals are stable but has leukocytosis and a cellulitis that is notable from his achilles tendon to mid calf This patient needs pain meds so he can get proper wound care  Disposition: follow up in the ER immediately  -     POCT [place]

## 2016-07-11 NOTE — ED Notes (Signed)
Bed: WA01 Expected date:  Expected time:  Means of arrival:  Comments: Triage 1 

## 2016-07-11 NOTE — H&P (Signed)
History and Physical    [name] [id number] DOB: 02-17-1946 DOA: 07/11/2016  PCP: Elizabeth Sauer Patient coming from: Home  Chief Complaint: Left leg infection  Interpreter: Lillia Dallas [id number]  HPI: Lee [name] is a 70 y.o. male with no medical history. Patient reports that about two weeks ago, he injured his left leg against the side of a chair. Since then, he has had a worsening wound left medial ankle wound. He tried neosporin which did not help. There has been increasing redness and pain with drainage. Nothing has made his wound worse.  ED Course: Vitals: Afebrile. Normotensive. Labs: sodium of 134, WBC of 11.8k, hemoglobin of 12.6 Imaging: Ankle x-ray significant for no sign of osteo with soft tissue swelling Medications/Course: Vancomycin. Blood cultures obtained  Review of Systems: Review of Systems  Constitutional: Negative for chills and fever.  Respiratory: Negative for cough, sputum production, shortness of breath and wheezing.   Cardiovascular: Negative for chest pain, palpitations and claudication.  Gastrointestinal: Negative for abdominal pain, constipation, diarrhea, nausea and vomiting.    Past Medical History:  Diagnosis Date  . Kidney stones     History reviewed. No pertinent surgical history.   reports that he has never smoked. He has never used smokeless tobacco. He reports that he does not drink alcohol or use drugs.  No Known Allergies  Family History  Problem Relation Age of Onset  . Stomach cancer Father     ?  . Colon cancer Neg Hx     Prior to Admission medications   Medication Sig Start Date End Date Taking? Authorizing Provider  acetaminophen (TYLENOL) 325 MG tablet Take 650 mg by mouth every 6 (six) hours as needed for moderate pain.   Yes Historical Provider, MD  Pseudoephedrine-APAP-DM (TYLENOL COLD/FLU SEVERE DAY PO) Take 1 capsule by mouth every 6 (six) hours as needed (cold symptoms).   Yes Historical Provider, MD    pyridOXINE (VITAMIN B-6) 100 MG tablet Take 100 mg by mouth daily. Reported on 11/02/2015   Yes Historical Provider, MD    Physical Exam: Vitals:   07/11/16 1013 07/11/16 1015 07/11/16 1142  BP: 129/59  108/58  Pulse: 78  74  Resp: 18  19  Temp: 98.3 F (36.8 C)    TempSrc: Oral    SpO2: 94%  96%  Weight:  68 kg (150 lb)   Height:  5' (1.524 m)      Constitutional: NAD, calm, comfortable Eyes: PERRL, lids and conjunctivae normal ENMT: Mucous membranes are moist. Posterior pharynx clear of any exudate or lesions. Dentures Neck: normal, supple, no masses, no thyromegaly Respiratory: clear to auscultation bilaterally, no wheezing, no crackles. Normal respiratory effort. No accessory muscle use.  Cardiovascular: Regular rate and rhythm, no murmurs / rubs / gallops. No extremity edema. 2+ pedal pulses. Abdomen: no tenderness, no masses palpated. Bowel sounds positive.  Musculoskeletal: no clubbing / cyanosis. No joint deformity upper and lower extremities. Good ROM, no contractures. Normal muscle tone.  Skin: Chronic venous changes in bilateral legs. Left medial leg with chronic appearing ulcerated lesion that appears somewhat healed with extensive erythema of left leg with tenderness. Neurologic: CN 2-12 grossly intact. Sensation intact, DTR normal. Strength 5/5 in all 4.  Psychiatric: Normal judgment and insight. Alert and oriented x 3. Normal mood.       Labs on Admission: I have personally reviewed following labs and imaging studies  CBC:  Recent Labs Lab 07/11/16 0917 07/11/16 1159  WBC 11.6* 11.8*  NEUTROABS  --  9.7*  HGB 12.0* 12.6*  HCT 33.4* 37.0*  MCV 91.6 93.2  PLT  --  0000000   Basic Metabolic Panel:  Recent Labs Lab 07/11/16 1159  NA 134*  K 3.7  CL 102  CO2 25  GLUCOSE 111*  BUN 21*  CREATININE 0.66  CALCIUM 8.8*   GFR: Estimated Creatinine Clearance: 70.5 mL/min (by C-G formula based on SCr of 0.66 mg/dL). Liver Function Tests:  Recent  Labs Lab 07/11/16 1159  AST 27  ALT 29  ALKPHOS 43  BILITOT 1.1  PROT 7.2  ALBUMIN 4.2   No results for input(s): LIPASE, AMYLASE in the last 168 hours. No results for input(s): AMMONIA in the last 168 hours. Coagulation Profile: No results for input(s): INR, PROTIME in the last 168 hours. Cardiac Enzymes:  Recent Labs Lab 07/11/16 1159  TROPONINI <0.03   BNP (last 3 results) No results for input(s): PROBNP in the last 8760 hours. HbA1C: No results for input(s): HGBA1C in the last 72 hours. CBG: No results for input(s): GLUCAP in the last 168 hours. Lipid Profile: No results for input(s): CHOL, HDL, LDLCALC, TRIG, CHOLHDL, LDLDIRECT in the last 72 hours. Thyroid Function Tests: No results for input(s): TSH, T4TOTAL, FREET4, T3FREE, THYROIDAB in the last 72 hours. Anemia Panel: No results for input(s): VITAMINB12, FOLATE, FERRITIN, TIBC, IRON, RETICCTPCT in the last 72 hours. Urine analysis:    Component Value Date/Time   COLORURINE STRAW (A) 07/11/2016 1310   APPEARANCEUR CLEAR 07/11/2016 1310   LABSPEC 1.009 07/11/2016 1310   PHURINE 6.0 07/11/2016 1310   GLUCOSEU NEGATIVE 07/11/2016 1310   HGBUR SMALL (A) 07/11/2016 1310   BILIRUBINUR NEGATIVE 07/11/2016 1310   BILIRUBINUR neg 12/27/2014 0853   KETONESUR 5 (A) 07/11/2016 1310   PROTEINUR NEGATIVE 07/11/2016 1310   UROBILINOGEN 0.2 12/27/2014 0853   NITRITE NEGATIVE 07/11/2016 1310   LEUKOCYTESUR NEGATIVE 07/11/2016 1310   Sepsis Labs: !!!!!!!!!!!!!!!!!!!!!!!!!!!!!!!!!!!!!!!!!!!! @LABRCNTIP (procalcitonin:4,lacticidven:4) )No results found for this or any previous visit (from the past 240 hour(s)).   Radiological Exams on Admission: Dg Ankle Complete Left  Result Date: 07/11/2016 CLINICAL DATA:  Wound of the right lower leg starting 2 days ago with redness and swelling. EXAM: LEFT ANKLE COMPLETE - 3+ VIEW COMPARISON:  None. FINDINGS: There is no evidence of fracture, dislocation, or joint effusion. There is no  evidence of arthropathy or other focal bone abnormality. Soft tissue swelling is noted around the ankle. IMPRESSION: Soft tissue swelling around the ankle. There is no evidence of osteomyelitis. Electronically Signed   By: Abelardo Diesel M.D.   On: 07/11/2016 12:33    EKG: Independently reviewed. Sinus rhythm  Assessment/Plan Active Problems:   Cellulitis  Cellulitis Ankle x-ray negative for osteomyelitis. Wound appears superficial. Cellulitis is extensive. -continue Vancomycin -venous duplex to rule out DVT (likely secondary to cellulitis but pretty significant swelling) -ABIs since wound has had such a difficult time healing -wound care   DVT prophylaxis: Lovenox Code Status: Full code Family Communication: Wife at bedside Disposition Plan: Anticipate discharge home in 2-3 days Consults called: None Admission status: Inpatient. Medical floor.   Cordelia Poche, MD [facility] Pager [phone]  If 7PM-7AM, please contact night-coverage [email or web address] Password [email or web address]  07/11/2016, 2:51 PM

## 2016-07-11 NOTE — Progress Notes (Signed)
Pharmacy Antibiotic Note  Lee [name] is a 70 y.o. male admitted on 07/11/2016 with cellulitis.  Pharmacy has been consulted for vancomycin dosing.  Plan: Vancomycin 1g x 1 then 750mg  IV every 12 hours.  Goal trough 10-15 mcg/mL.  Height: 5' (152.4 cm) Weight: 150 lb (68 kg) IBW/kg (Calculated) : 50  Temp (24hrs), Avg:98.9 F (37.2 C), Min:98.3 F (36.8 C), Max:99.5 F (37.5 C)   Recent Labs Lab 07/11/16 0917 07/11/16 1159 07/11/16 1219  WBC 11.6* 11.8*  --   CREATININE  --  0.66  --   LATICACIDVEN  --   --  0.87    Estimated Creatinine Clearance: 70.5 mL/min (by C-G formula based on SCr of 0.66 mg/dL).    No Known Allergies   Thank you for allowing pharmacy to be a part of this patient's care.   Adrian Saran, PharmD, BCPS Pager [phone] 07/11/2016 1:01 PM

## 2016-07-11 NOTE — ED Notes (Signed)
2nd blood culture collected at 1200.

## 2016-07-11 NOTE — ED Triage Notes (Signed)
Patients wound/leg swelling to left lower leg starting 2 days ago. Reddness/swelling noted to left lower leg. Patient ambulatory to triage.

## 2016-07-11 NOTE — ED Notes (Signed)
Verbal with MD to d/c x2 lactic.

## 2016-07-12 ENCOUNTER — Inpatient Hospital Stay (HOSPITAL_COMMUNITY): Payer: Medicare Other

## 2016-07-12 DIAGNOSIS — I872 Venous insufficiency (chronic) (peripheral): Secondary | ICD-10-CM

## 2016-07-12 DIAGNOSIS — L03116 Cellulitis of left lower limb: Secondary | ICD-10-CM

## 2016-07-12 DIAGNOSIS — D649 Anemia, unspecified: Secondary | ICD-10-CM

## 2016-07-12 LAB — BASIC METABOLIC PANEL
Anion gap: 4 — ABNORMAL LOW (ref 5–15)
BUN: 18 mg/dL (ref 6–20)
CALCIUM: 8.1 mg/dL — AB (ref 8.9–10.3)
CHLORIDE: 112 mmol/L — AB (ref 101–111)
CO2: 24 mmol/L (ref 22–32)
CREATININE: 0.55 mg/dL — AB (ref 0.61–1.24)
Glucose, Bld: 119 mg/dL — ABNORMAL HIGH (ref 65–99)
Potassium: 4 mmol/L (ref 3.5–5.1)
SODIUM: 140 mmol/L (ref 135–145)

## 2016-07-12 LAB — CBC
HCT: 33.1 % — ABNORMAL LOW (ref 39.0–52.0)
HEMOGLOBIN: 11.1 g/dL — AB (ref 13.0–17.0)
MCH: 31.3 pg (ref 26.0–34.0)
MCHC: 33.5 g/dL (ref 30.0–36.0)
MCV: 93.2 fL (ref 78.0–100.0)
PLATELETS: 248 10*3/uL (ref 150–400)
RBC: 3.55 MIL/uL — ABNORMAL LOW (ref 4.22–5.81)
RDW: 13.3 % (ref 11.5–15.5)
WBC: 7.9 10*3/uL (ref 4.0–10.5)

## 2016-07-12 NOTE — Progress Notes (Signed)
VASCULAR LAB PRELIMINARY  ARTERIAL  ABI completed: Bilateral ABI is within normal limits, however right ABI is borderline falsely elevated.     RIGHT    LEFT    PRESSURE WAVEFORM  PRESSURE WAVEFORM  BRACHIAL 127 Tri BRACHIAL 126 Tri  AT 168 Tri AT 157 Tri  PT 162 Tri PT  Unable to obtain due to bandage location  PER   PER 163 Tri    RIGHT LEFT  ABI 1.32 1.28    Landry Mellow, RDMS, RVT   07/12/2016, 2:06 PM

## 2016-07-12 NOTE — Consult Note (Signed)
[name] Nurse wound consult note Reason for Consult: Recent wounding to left medial malleolus (trauma) in the presence of suspected venous insufficiency, hemosiderin staining noted in this extremity.  Patient with healed wound of similar etiology from sometime in the past (perhaps last year according to son).  Hemosiderin staining in the RLE medial malleolar areas noted. Wound type: Trauma, (suspected) Venous insufficiency. (ABI pending) Pressure Ulcer POA: No Measurement: 4cm x 3cm x 0.1cm area of partial thickness skin loss.  Pink wound bed, scant serous exudate with LE elevated Wound bed:As described above Drainage (amount, consistency, odor) As described above Periwound:hemosiderin staining, edema, warmth. Outline of erythema has been made previously with a skin marking pen.  No advancement of erythema.  On systemic antibiotics. Dressing procedure/placement/frequency: I will provide Nursing with guidance for topical care to support moist wound healing via the Orders (white petrolatum gauze, Vaseline) covered with roll gauze from toe to knee and topped with ACE bandage wrapped in a similar fashion.  This will provide minimal compression (15-42mmHg).  After healing and  reduction in edema, patient would benefit form compression stockings bilaterally.  These can be ordered by his PCP or OTC versions obtained from a medical supply store. [facility] nursing team will not follow, but will remain available to this patient, the nursing and medical teams.  Please re-consult if needed. Thanks, Maudie Flakes, MSN, RN, [place], [name]  Pager# [phone]
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
## 2016-07-12 NOTE — Progress Notes (Signed)
*  PRELIMINARY RESULTS* Vascular Ultrasound Left lower extremity venous duplex has been completed.  Preliminary findings: No evidence of DVT or baker's cyst.  Landry Mellow, RDMS, RVT  07/12/2016, 1:25 PM

## 2016-07-13 DIAGNOSIS — R739 Hyperglycemia, unspecified: Secondary | ICD-10-CM

## 2016-07-13 LAB — COMPREHENSIVE METABOLIC PANEL
ALBUMIN: 3.3 g/dL — AB (ref 3.5–5.0)
ALT: 23 U/L (ref 17–63)
ANION GAP: 6 (ref 5–15)
AST: 17 U/L (ref 15–41)
Alkaline Phosphatase: 40 U/L (ref 38–126)
BILIRUBIN TOTAL: 0.8 mg/dL (ref 0.3–1.2)
BUN: 12 mg/dL (ref 6–20)
CHLORIDE: 108 mmol/L (ref 101–111)
CO2: 25 mmol/L (ref 22–32)
Calcium: 8.3 mg/dL — ABNORMAL LOW (ref 8.9–10.3)
Creatinine, Ser: 0.52 mg/dL — ABNORMAL LOW (ref 0.61–1.24)
GFR calc Af Amer: >60 mL/min (ref >60)
GLUCOSE: 133 mg/dL — AB (ref 65–99)
POTASSIUM: 3.9 mmol/L (ref 3.5–5.1)
Sodium: 139 mmol/L (ref 135–145)
TOTAL PROTEIN: 6.3 g/dL — AB (ref 6.5–8.1)

## 2016-07-13 LAB — PHOSPHORUS: Phosphorus: 2.6 mg/dL (ref 2.5–4.6)

## 2016-07-13 LAB — CBC WITH DIFFERENTIAL/PLATELET
BASOS ABS: 0 10*3/uL (ref 0.0–0.1)
BASOS PCT: 0 %
EOS PCT: 2 %
Eosinophils Absolute: 0.1 10*3/uL (ref 0.0–0.7)
HEMATOCRIT: 34.7 % — AB (ref 39.0–52.0)
Hemoglobin: 11.6 g/dL — ABNORMAL LOW (ref 13.0–17.0)
Lymphocytes Relative: 17 %
Lymphs Abs: 1.2 10*3/uL (ref 0.7–4.0)
MCH: 31.2 pg (ref 26.0–34.0)
MCHC: 33.4 g/dL (ref 30.0–36.0)
MCV: 93.3 fL (ref 78.0–100.0)
MONO ABS: 0.5 10*3/uL (ref 0.1–1.0)
MONOS PCT: 7 %
NEUTROS ABS: 5.3 10*3/uL (ref 1.7–7.7)
Neutrophils Relative %: 74 %
PLATELETS: 267 10*3/uL (ref 150–400)
RBC: 3.72 MIL/uL — ABNORMAL LOW (ref 4.22–5.81)
RDW: 13.4 % (ref 11.5–15.5)
WBC: 7.1 10*3/uL (ref 4.0–10.5)

## 2016-07-13 LAB — MAGNESIUM: MAGNESIUM: 2 mg/dL (ref 1.7–2.4)

## 2016-07-13 MED ORDER — AMOXICILLIN-POT CLAVULANATE 875-125 MG PO TABS: 1.0000 | ORAL_TABLET | Freq: Two times a day (BID) | ORAL | 0 refills | Status: DC
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
## 2016-07-13 NOTE — Progress Notes (Signed)
This CM met with pt, spouse and son at bedside for disposition planning. Wife states that they go to the [facility] on [address] for PCP. This office has primary care services as well. This CM inquired if pt would like to stay at this office for PCP or if this CM could provide them with a PCP list for their area. Wife states they are happy at [facility] and will eventually get a referral from them to another PCP, but would like to stay there for now. No other CM needs communicated. Marney Doctor RN,BSN,NCM [phone]

## 2016-07-14 DIAGNOSIS — I83203 Varicose veins of unspecified lower extremity with both ulcer of ankle and inflammation: Secondary | ICD-10-CM | POA: Diagnosis not present

## 2016-07-16 LAB — CULTURE, BLOOD (ROUTINE X 2)
CULTURE: NO GROWTH
Culture: NO GROWTH

## 2016-07-19 DIAGNOSIS — I83203 Varicose veins of unspecified lower extremity with both ulcer of ankle and inflammation: Secondary | ICD-10-CM | POA: Diagnosis not present

## 2016-07-25 DIAGNOSIS — I83203 Varicose veins of unspecified lower extremity with both ulcer of ankle and inflammation: Secondary | ICD-10-CM | POA: Diagnosis not present

## 2016-08-10 DIAGNOSIS — Z23 Encounter for immunization: Secondary | ICD-10-CM | POA: Diagnosis not present

## 2016-08-25 DIAGNOSIS — I8312 Varicose veins of left lower extremity with inflammation: Secondary | ICD-10-CM | POA: Diagnosis not present

## 2016-08-29 DIAGNOSIS — I8312 Varicose veins of left lower extremity with inflammation: Secondary | ICD-10-CM | POA: Diagnosis not present

## 2016-09-15 DIAGNOSIS — I8312 Varicose veins of left lower extremity with inflammation: Secondary | ICD-10-CM | POA: Diagnosis not present

## 2016-09-18 DIAGNOSIS — I8312 Varicose veins of left lower extremity with inflammation: Secondary | ICD-10-CM | POA: Diagnosis not present

## 2016-09-26 DIAGNOSIS — I8312 Varicose veins of left lower extremity with inflammation: Secondary | ICD-10-CM | POA: Diagnosis not present

## 2016-10-13 DIAGNOSIS — I8312 Varicose veins of left lower extremity with inflammation: Secondary | ICD-10-CM | POA: Diagnosis not present

## 2016-11-01 DIAGNOSIS — I8311 Varicose veins of right lower extremity with inflammation: Secondary | ICD-10-CM | POA: Diagnosis not present

## 2016-11-03 DIAGNOSIS — I8311 Varicose veins of right lower extremity with inflammation: Secondary | ICD-10-CM | POA: Diagnosis not present

## 2016-11-15 DIAGNOSIS — I8311 Varicose veins of right lower extremity with inflammation: Secondary | ICD-10-CM | POA: Diagnosis not present

## 2016-11-29 DIAGNOSIS — I8311 Varicose veins of right lower extremity with inflammation: Secondary | ICD-10-CM | POA: Diagnosis not present

## 2016-12-25 ENCOUNTER — Ambulatory Visit (INDEPENDENT_AMBULATORY_CARE_PROVIDER_SITE_OTHER): Payer: Medicare Other | Admitting: Emergency Medicine

## 2016-12-25 ENCOUNTER — Encounter: Payer: Self-pay | Admitting: Emergency Medicine

## 2016-12-25 ENCOUNTER — Ambulatory Visit (INDEPENDENT_AMBULATORY_CARE_PROVIDER_SITE_OTHER): Payer: Medicare Other

## 2016-12-25 VITALS — BP 124/76 | HR 73 | Temp 98.6°F | Resp 17 | Ht 62.5 in | Wt 157.0 lb

## 2016-12-25 DIAGNOSIS — J069 Acute upper respiratory infection, unspecified: Secondary | ICD-10-CM | POA: Diagnosis not present

## 2016-12-25 DIAGNOSIS — R059 Cough, unspecified: Secondary | ICD-10-CM | POA: Insufficient documentation

## 2016-12-25 DIAGNOSIS — R05 Cough: Secondary | ICD-10-CM | POA: Insufficient documentation

## 2016-12-25 MED ORDER — PREDNISONE 20 MG PO TABS: 20.0000 mg | ORAL_TABLET | Freq: Every day | ORAL | 1 refills | Status: AC

## 2016-12-25 MED ORDER — PROMETHAZINE-CODEINE 6.25-10 MG/5ML PO SYRP: 5.0000 mL | ORAL_SOLUTION | Freq: Every evening | ORAL | 0 refills | Status: DC | PRN

## 2016-12-25 NOTE — Progress Notes (Signed)
Lee [name] 71 y.o.   Chief Complaint  Patient presents with  . Cough  . URI    HISTORY OF PRESENT ILLNESS: This is a 71 y.o. male complaining of URI symptoms and cough x 3-4 days.  Cough  This is a new problem. The current episode started in the past 7 days. The problem has been waxing and waning. The problem occurs every few minutes. The cough is productive of sputum. Associated symptoms include nasal congestion, postnasal drip and rhinorrhea. Pertinent negatives include no chest pain, chills, ear congestion, ear pain, eye redness, fever, headaches, hemoptysis, myalgias, rash, sore throat, shortness of breath, sweats, weight loss or wheezing. The symptoms are aggravated by pollens and dust.  URI   This is a new problem. The current episode started in the past 7 days. The problem has been waxing and waning. There has been no fever. Associated symptoms include congestion, coughing and rhinorrhea. Pertinent negatives include no chest pain, diarrhea, dysuria, ear pain, headaches, nausea, neck pain, rash, sinus pain, sore throat, vomiting or wheezing.     Prior to Admission medications   Medication Sig Start Date End Date Taking? Authorizing Provider  acetaminophen (TYLENOL) 325 MG tablet Take 650 mg by mouth every 6 (six) hours as needed for moderate pain.   Yes [provider]  pyridOXINE (VITAMIN B-6) 100 MG tablet Take 100 mg by mouth daily. Reported on 11/02/2015   Yes [provider]  amoxicillin-clavulanate (AUGMENTIN) 875-125 MG tablet Take 1 tablet by mouth 2 (two) times daily. Patient not taking: Reported on 12/25/2016 07/13/16   Raiford Noble Latif, DO    No Known Allergies  Patient Active Problem List   Diagnosis Date Noted  . Cellulitis 07/11/2016    Past Medical History:  Diagnosis Date  . Kidney stones     No past surgical history on file.  Social History   Social History  . Marital status: Unknown    Spouse name: N/A  . Number of  children: 2  . Years of education: N/A   Occupational History  . resturant    Social History Main Topics  . Smoking status: Never Smoker  . Smokeless tobacco: Never Used  . Alcohol use No  . Drug use: No  . Sexual activity: Not on file   Other Topics Concern  . Not on file   Social History Narrative  . No narrative on file    Family History  Problem Relation Age of Onset  . Stomach cancer Father        ?  . Colon cancer Neg Hx      Review of Systems  Constitutional: Negative for chills, fever, malaise/fatigue and weight loss.  HENT: Positive for congestion, postnasal drip and rhinorrhea. Negative for ear pain, sinus pain and sore throat.   Eyes: Negative.  Negative for blurred vision, double vision, discharge and redness.  Respiratory: Positive for cough. Negative for hemoptysis, shortness of breath and wheezing.   Cardiovascular: Negative for chest pain and leg swelling.  Gastrointestinal: Negative for diarrhea, nausea and vomiting.  Genitourinary: Negative for dysuria and hematuria.  Musculoskeletal: Negative for myalgias and neck pain.  Skin: Negative for rash.  Neurological: Negative for dizziness, sensory change, focal weakness and headaches.  Endo/Heme/Allergies: Negative.   All other systems reviewed and are negative.   Vitals:   12/25/16 0845  BP: 124/76  Pulse: 73  Resp: 17  Temp: 98.6 F (37 C)    Physical Exam  Constitutional: He is oriented to  person, place, and time. He appears well-developed and well-nourished.  HENT:  Head: Normocephalic and atraumatic.  Right Ear: External ear normal.  Left Ear: External ear normal.  Nose: Nose normal.  Mouth/Throat: Oropharynx is clear and moist. No oropharyngeal exudate.  Eyes: Conjunctivae and EOM are normal. Pupils are equal, round, and reactive to light.  Neck: Normal range of motion. Neck supple. No JVD present. No thyromegaly present.  Cardiovascular: Normal rate, regular rhythm, normal heart sounds  and intact distal pulses.   Pulmonary/Chest: Effort normal and breath sounds normal.  Abdominal: Soft. Bowel sounds are normal. He exhibits no distension. There is no tenderness.  Musculoskeletal: Normal range of motion.  Lymphadenopathy:    He has no cervical adenopathy.  Neurological: He is alert and oriented to person, place, and time. No sensory deficit. He exhibits normal muscle tone.  Skin: Skin is warm and dry. Capillary refill takes less than 2 seconds.  Psychiatric: He has a normal mood and affect. His behavior is normal.  Vitals reviewed.   Dg Chest 2 View  Result Date: 12/25/2016 CLINICAL DATA:  Cough. EXAM: CHEST  2 VIEW COMPARISON:  Radiographs of November 02, 2015 FINDINGS: The heart size and mediastinal contours are within normal limits. Both lungs are clear. No pneumothorax or pleural effusion is noted. Atherosclerosis of thoracic aorta is noted. The visualized skeletal structures are unremarkable. IMPRESSION: No active cardiopulmonary disease.  Aortic atherosclerosis. Electronically Signed   By: Marijo Conception, M.D.   On: 12/25/2016 09:54     ASSESSMENT & PLAN: Jolon was seen today for cough and uri.  Diagnoses and all orders for this visit:  Cough -     DG Chest 2 View; Future  Acute upper respiratory infection  Other orders -     promethazine-codeine (PHENERGAN WITH CODEINE) 6.25-10 MG/5ML syrup; Take 5 mLs by mouth at bedtime as needed for cough. -     predniSONE (DELTASONE) 20 MG tablet; Take 1 tablet (20 mg total) by mouth daily with breakfast.    Patient Instructions       IF you received an x-ray today, you will receive an invoice from [facility]. Please contact [facility] at [phone] with questions or concerns regarding your invoice.   IF you received labwork today, you will receive an invoice from [facility]. Please contact [facility] at [phone] with questions or concerns regarding your invoice.   Our billing staff will  not be able to assist you with questions regarding bills from these companies.  You will be contacted with the lab results as soon as they are available. The fastest way to get your results is to activate your My Chart account. Instructions are located on the last page of this paperwork. If you have not heard from [place] regarding the results in 2 weeks, please contact this office.      Upper Respiratory Infection, Adult Most upper respiratory infections (URIs) are caused by a virus. A URI affects the nose, throat, and upper air passages. The most common type of URI is often called "the common cold." Follow these instructions at home:  Take medicines only as told by your doctor.  Gargle warm saltwater or take cough drops to comfort your throat as told by your doctor.  Use a warm mist humidifier or inhale steam from a shower to increase air moisture. This may make it easier to breathe.  Drink enough fluid to keep your pee (urine) clear or pale yellow.  Eat soups and other clear broths.  Have a healthy diet.  Rest as needed.  Go back to work when your fever is gone or your doctor says it is okay.  You may need to stay home longer to avoid giving your URI to others.  You can also wear a face mask and wash your hands often to prevent spread of the virus.  Use your inhaler more if you have asthma.  Do not use any tobacco products, including cigarettes, chewing tobacco, or electronic cigarettes. If you need help quitting, ask your doctor. Contact a doctor if:  You are getting worse, not better.  Your symptoms are not helped by medicine.  You have chills.  You are getting more short of breath.  You have brown or red mucus.  You have yellow or brown discharge from your nose.  You have pain in your face, especially when you bend forward.  You have a fever.  You have puffy (swollen) neck glands.  You have pain while swallowing.  You have white areas in the back of your  throat. Get help right away if:  You have very bad or constant:  Headache.  Ear pain.  Pain in your forehead, behind your eyes, and over your cheekbones (sinus pain).  Chest pain.  You have long-lasting (chronic) lung disease and any of the following:  Wheezing.  Long-lasting cough.  Coughing up blood.  A change in your usual mucus.  You have a stiff neck.  You have changes in your:  Vision.  Hearing.  Thinking.  Mood. This information is not intended to replace advice given to you by your health care provider. Make sure you discuss any questions you have with your health care provider. Document Released: 01/10/2008 Document Revised: 03/26/2016 Document Reviewed: 10/29/2013 Elsevier Interactive Patient Education  2017 Elsevier Inc.      Agustina Caroli, MD [facility]

## 2016-12-25 NOTE — Patient Instructions (Addendum)
     IF you received an x-ray today, you will receive an invoice from [facility]. Please contact  Radiology at [phone] with questions or concerns regarding your invoice.   IF you received labwork today, you will receive an invoice from [facility]. Please contact [facility] at [phone] with questions or concerns regarding your invoice.   Our billing staff will not be able to assist you with questions regarding bills from these companies.  You will be contacted with the lab results as soon as they are available. The fastest way to get your results is to activate your My Chart account. Instructions are located on the last page of this paperwork. If you have not heard from us regarding the results in 2 weeks, please contact this office.      Upper Respiratory Infection, Adult Most upper respiratory infections (URIs) are caused by a virus. A URI affects the nose, throat, and upper air passages. The most common type of URI is often called "the common cold." Follow these instructions at home:  Take medicines only as told by your doctor.  Gargle warm saltwater or take cough drops to comfort your throat as told by your doctor.  Use a warm mist humidifier or inhale steam from a shower to increase air moisture. This may make it easier to breathe.  Drink enough fluid to keep your pee (urine) clear or pale yellow.  Eat soups and other clear broths.  Have a healthy diet.  Rest as needed.  Go back to work when your fever is gone or your doctor says it is okay.  You may need to stay home longer to avoid giving your URI to others.  You can also wear a face mask and wash your hands often to prevent spread of the virus.  Use your inhaler more if you have asthma.  Do not use any tobacco products, including cigarettes, chewing tobacco, or electronic cigarettes. If you need help quitting, ask your doctor. Contact a doctor if:  You are getting worse, not better.  Your  symptoms are not helped by medicine.  You have chills.  You are getting more short of breath.  You have brown or red mucus.  You have yellow or brown discharge from your nose.  You have pain in your face, especially when you bend forward.  You have a fever.  You have puffy (swollen) neck glands.  You have pain while swallowing.  You have white areas in the back of your throat. Get help right away if:  You have very bad or constant:  Headache.  Ear pain.  Pain in your forehead, behind your eyes, and over your cheekbones (sinus pain).  Chest pain.  You have long-lasting (chronic) lung disease and any of the following:  Wheezing.  Long-lasting cough.  Coughing up blood.  A change in your usual mucus.  You have a stiff neck.  You have changes in your:  Vision.  Hearing.  Thinking.  Mood. This information is not intended to replace advice given to you by your health care provider. Make sure you discuss any questions you have with your health care provider. Document Released: 01/10/2008 Document Revised: 03/26/2016 Document Reviewed: 10/29/2013 Elsevier Interactive Patient Education  2017 Elsevier Inc.  

## 2017-01-03 DIAGNOSIS — M7981 Nontraumatic hematoma of soft tissue: Secondary | ICD-10-CM | POA: Diagnosis not present

## 2017-05-03 DIAGNOSIS — Z23 Encounter for immunization: Secondary | ICD-10-CM | POA: Diagnosis not present

## 2017-08-04 ENCOUNTER — Other Ambulatory Visit: Payer: Self-pay

## 2017-08-04 ENCOUNTER — Encounter: Payer: Self-pay | Admitting: Physician Assistant

## 2017-08-04 ENCOUNTER — Ambulatory Visit (INDEPENDENT_AMBULATORY_CARE_PROVIDER_SITE_OTHER): Payer: Medicare Other | Admitting: Physician Assistant

## 2017-08-04 VITALS — BP 131/81 | HR 60 | Temp 98.4°F | Resp 16 | Ht 62.0 in | Wt 155.6 lb

## 2017-08-04 DIAGNOSIS — R0781 Pleurodynia: Secondary | ICD-10-CM | POA: Diagnosis not present

## 2017-08-04 NOTE — Progress Notes (Signed)
    08/04/2017 10:47 AM   DOB: Jun 14, 1946 / MRN: [id number]  SUBJECTIVE:  Lee [name] is a 71 y.o. male presenting for   He has No Known Allergies.   He  has a past medical history of Kidney stones.    He  reports that  has never smoked. he has never used smokeless tobacco. He reports that he does not drink alcohol or use drugs. He  has no sexual activity history on file. The patient  has no past surgical history on file.  His family history includes Stomach cancer in his father.  Review of Systems  Constitutional: Negative for chills and fever.  Respiratory: Negative for cough, hemoptysis, shortness of breath and wheezing.   Cardiovascular: Positive for chest pain. Negative for palpitations, orthopnea, claudication, leg swelling and PND.  Gastrointestinal: Negative for nausea.  Neurological: Negative for dizziness.    The problem list and medications were reviewed and updated by myself where necessary and exist elsewhere in the encounter.   OBJECTIVE:  BP 131/81   Pulse 60   Temp 98.4 F (36.9 C) (Oral)   Resp 16   Ht 5\' 2"  (1.575 m)   Wt 155 lb 9.6 oz (70.6 kg)   SpO2 98%   BMI 28.46 kg/m   Physical Exam  Constitutional: He appears well-developed. He is active and cooperative.  Non-toxic appearance.  Cardiovascular: Normal rate, regular rhythm, S1 normal, S2 normal, normal heart sounds, intact distal pulses and normal pulses. Exam reveals no gallop and no friction rub.  No murmur heard. Pulmonary/Chest: Effort normal. No stridor. No tachypnea. No respiratory distress. He has no wheezes. He has no rales.  No bruising or crepitus  Abdominal: He exhibits no distension.  Musculoskeletal: He exhibits no edema.  Neurological: He is alert.  Skin: Skin is warm and dry. He is not diaphoretic. No pallor.  Vitals reviewed.   No results found for this or any previous visit (from the past 72 hour(s)).  No results found.  ASSESSMENT AND PLAN:  [name] was seen today  for chest.  Diagnoses and all orders for this visit:  Rib pain on right side: Exam benign. Advised tylenol per avs.     The patient is advised to call or return to clinic if he does not see an improvement in symptoms, or to seek the care of the closest emergency department if he worsens with the above plan.   Philis Fendt, MHS, PA-C [facility] 08/04/2017 10:47 AM

## 2017-08-04 NOTE — Patient Instructions (Addendum)
Please take 1000 mg of tylenol as needed for pain.      IF you received an x-ray today, you will receive an invoice from [facility]. Please contact [facility] at [phone] with questions or concerns regarding your invoice.   IF you received labwork today, you will receive an invoice from [facility]. Please contact [facility] at [phone] with questions or concerns regarding your invoice.   Our billing staff will not be able to assist you with questions regarding bills from these companies.  You will be contacted with the lab results as soon as they are available. The fastest way to get your results is to activate your My Chart account. Instructions are located on the last page of this paperwork. If you have not heard from [place] regarding the results in 2 weeks, please contact this office.

## 2017-12-01 IMAGING — DX DG ANKLE COMPLETE 3+V*L*
3 series · 3 of 3 positions shown · non-contrast
Comparison: None.

CLINICAL DATA: Wound of the right lower leg starting 2 days ago
with redness and swelling.

EXAM:
LEFT ANKLE COMPLETE - 3+ VIEW

[ankle obl]
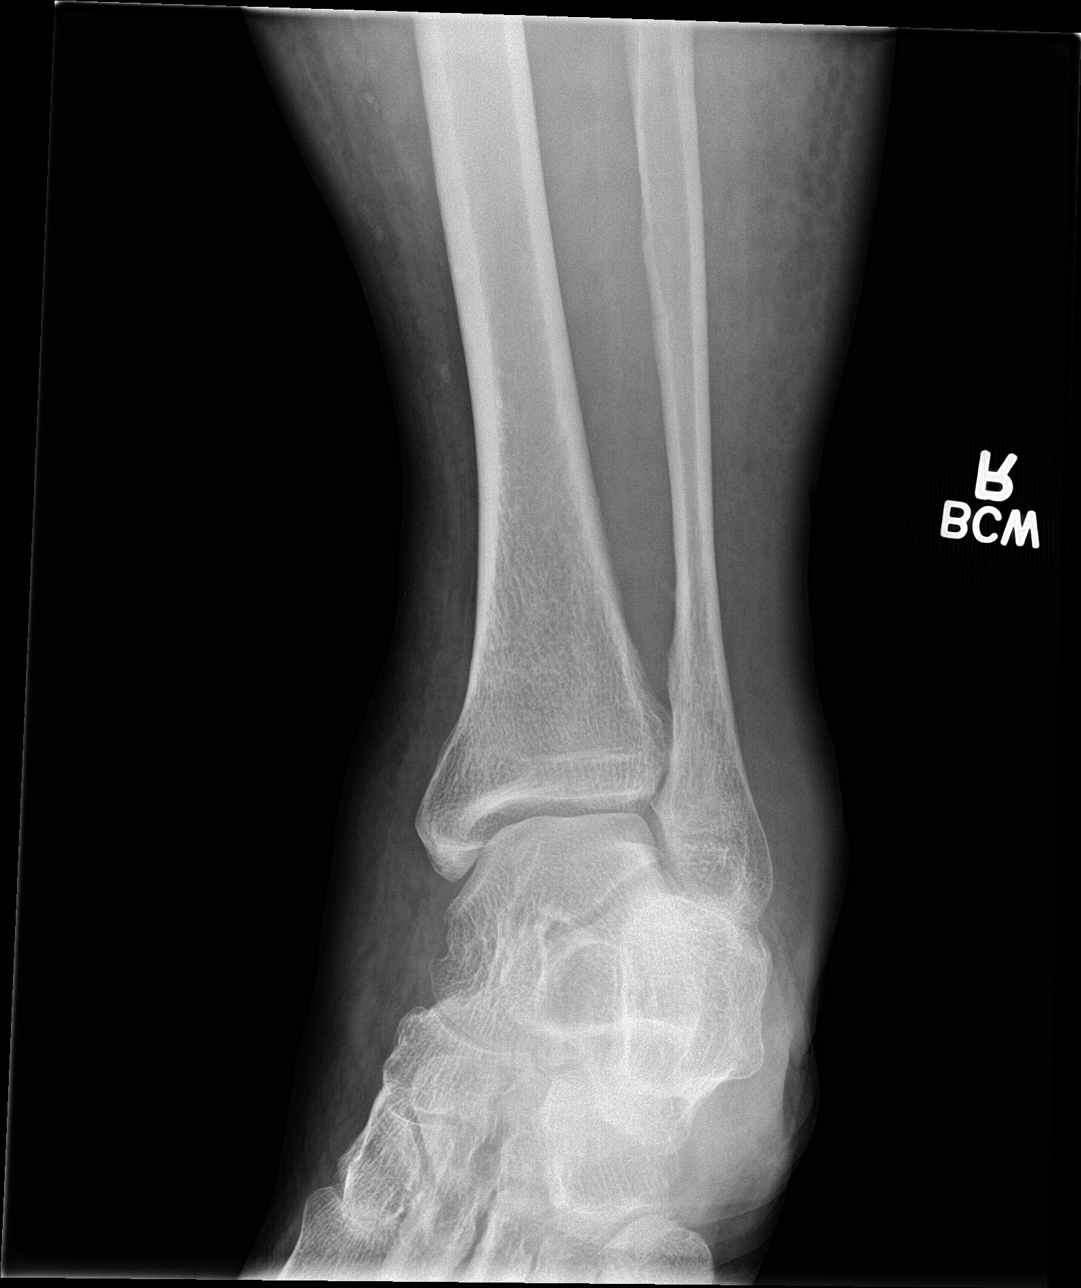

[ankle lat]
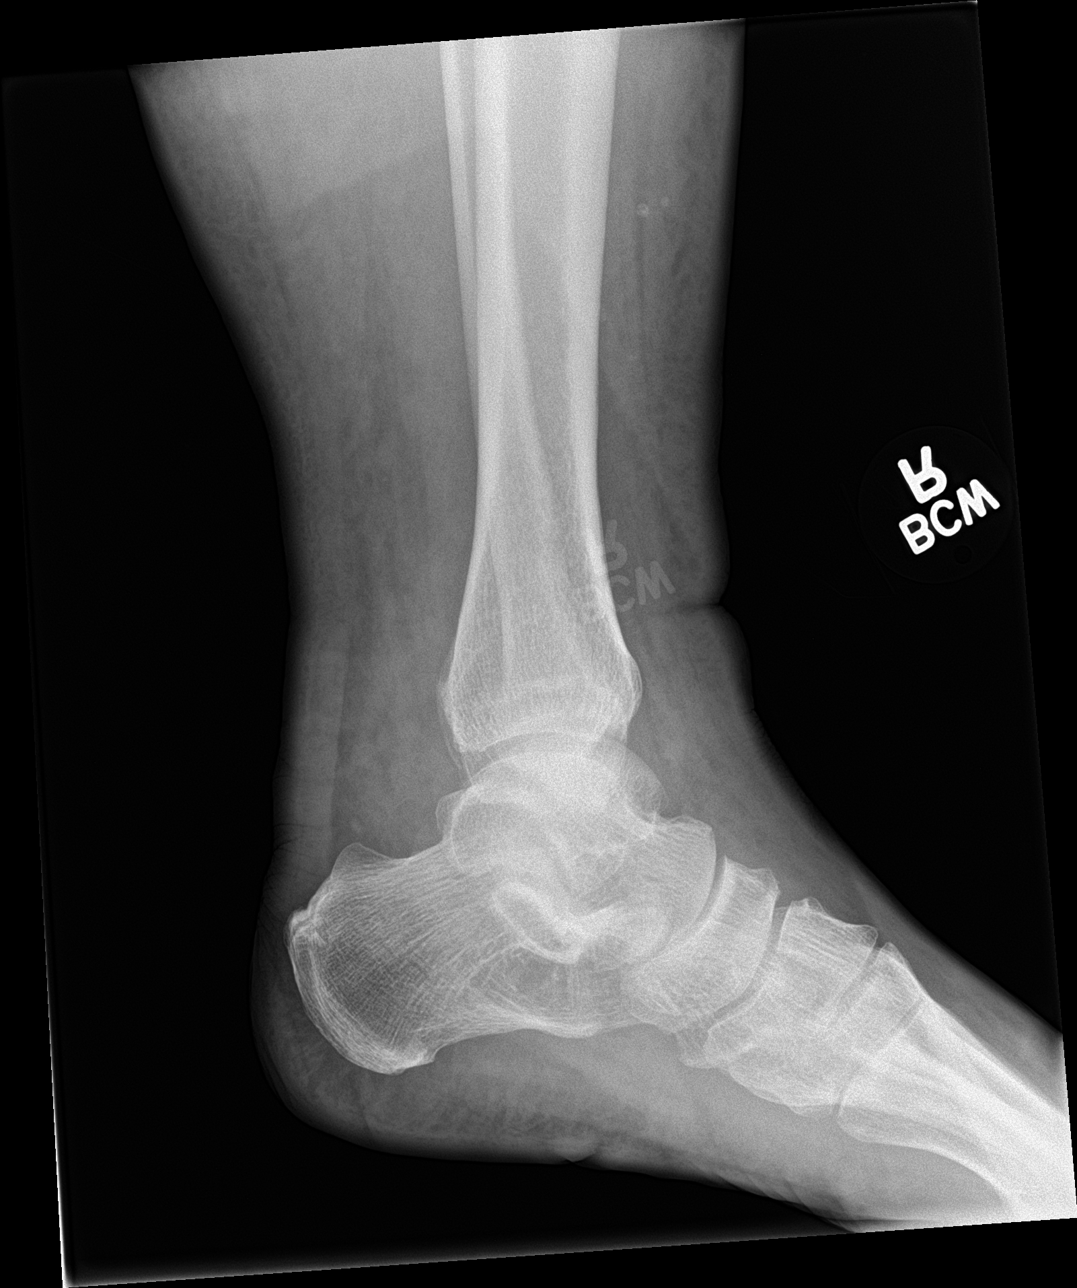

[ankle ap]
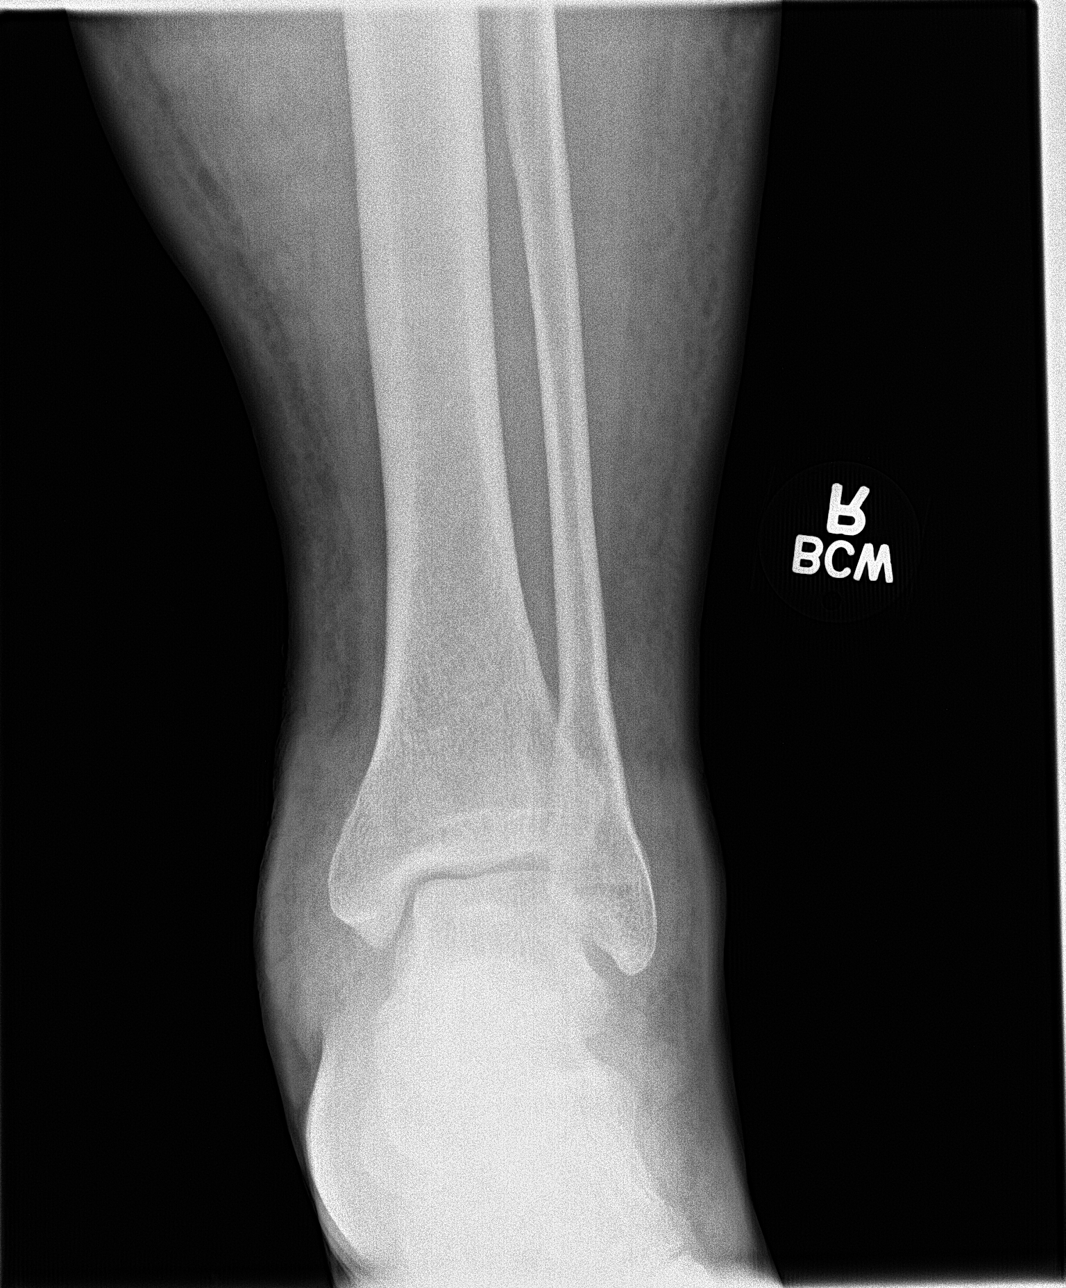

[3 of 3 positions shown; findings below may reference images not displayed]

FINDINGS: There is no evidence of fracture, dislocation, or joint effusion.
There is no evidence of arthropathy or other focal bone abnormality.
Soft tissue swelling is noted around the ankle.
IMPRESSION: Soft tissue swelling around the ankle. There is no evidence of
osteomyelitis.

## 2018-04-12 ENCOUNTER — Ambulatory Visit (INDEPENDENT_AMBULATORY_CARE_PROVIDER_SITE_OTHER): Payer: Medicare Other | Admitting: Physician Assistant

## 2018-04-12 ENCOUNTER — Other Ambulatory Visit: Payer: Self-pay

## 2018-04-12 ENCOUNTER — Encounter: Payer: Self-pay | Admitting: Physician Assistant

## 2018-04-12 VITALS — BP 100/60 | HR 79 | Temp 98.0°F | Resp 18 | Ht 62.0 in | Wt 153.0 lb

## 2018-04-12 DIAGNOSIS — L719 Rosacea, unspecified: Secondary | ICD-10-CM | POA: Diagnosis not present

## 2018-04-12 DIAGNOSIS — R0781 Pleurodynia: Secondary | ICD-10-CM | POA: Diagnosis not present

## 2018-04-12 DIAGNOSIS — Z23 Encounter for immunization: Secondary | ICD-10-CM | POA: Diagnosis not present

## 2018-04-12 MED ORDER — METRONIDAZOLE 1 % EX GEL: Freq: Every day | CUTANEOUS | 0 refills | Status: DC

## 2018-04-12 NOTE — Progress Notes (Signed)
Lee [name]  MRN: [id number] DOB: Apr 16, 1946  PCP: Mancel Bale, PA-C  Chief Complaint  Patient presents with  . Facial Pain    pimples on left side of face   . Chest Pain    rib pain on right side slipped and fell     Subjective:  Pt presents to clinic for rash on the left side of face - he has had for a really long time - he has used clindamycin 1% but it is not working well.  In the past when he has used the cream the rash goes away.  He also has a similar area on the right temple just under the arms of his glasses. He has known acne rocasea.  Next  Month going home to [place] to see his family.  6 day ago he fall and hit his right ribs - he tripped on the concrete.  The pain has not really gotten.  No pain with breathing.  It is hurting when he goes to bed - movement during the day he is fine and he has been able to continue his morning exercise.    History is obtained by patient.  Review of Systems  Constitutional: Negative for chills and fever.  Skin: Positive for rash.    There are no active problems to display for this patient.   Current Outpatient Medications on File Prior to Visit  Medication Sig Dispense Refill  . pyridOXINE (VITAMIN B-6) 100 MG tablet Take 100 mg by mouth daily. Reported on 11/02/2015     No current facility-administered medications on file prior to visit.     No Known Allergies  Past Medical History:  Diagnosis Date  . Kidney stones    Social History   Social History Narrative  . Not on file   Social History   Tobacco Use  . Smoking status: Never Smoker  . Smokeless tobacco: Never Used  Substance Use Topics  . Alcohol use: No    Alcohol/week: 0.0 standard drinks  . Drug use: No   family history includes Stomach cancer in his father.     Objective:  BP 100/60   Pulse 79   Temp 98 F (36.7 C) (Oral)   Resp 18   Ht 5\' 2"  (1.575 m)   Wt 153 lb (69.4 kg)   SpO2 96%   BMI 27.98 kg/m  Body mass index is 27.98  kg/m.  Wt Readings from Last 3 Encounters:  04/12/18 153 lb (69.4 kg)  08/04/17 155 lb 9.6 oz (70.6 kg)  12/25/16 157 lb (71.2 kg)    Physical Exam  Constitutional: He is oriented to person, place, and time. He appears well-developed and well-nourished.  HENT:  Head: Normocephalic and atraumatic.  Right Ear: External ear normal.  Left Ear: External ear normal.  Eyes: Conjunctivae are normal.  Neck: Normal range of motion.  Cardiovascular: Normal rate, regular rhythm and normal heart sounds.  No murmur heard. Pulmonary/Chest: Effort normal and breath sounds normal. He has no wheezes.  Neurological: He is alert and oriented to person, place, and time.  Skin: Skin is warm and dry. Lesion noted. There is erythema.     Psychiatric: Judgment normal.  Vitals reviewed.   Assessment and Plan :  Acne rosacea - Plan: metroNIDAZOLE (METROGEL) 1 % gel - stop clindamycin as this does not seem to be helping.  He has been on Doxy in the past but he does not feel like it helped much.  Flu vaccine need -  Plan: Flu vaccine HIGH DOSE PF (Fluzone High dose)  Rib pain on right side - suspect contusion - ok to use tylenol and or motrin at night  Will check with paper chart to see his immunizations - he does not remember which ones he has gotten.  He is good for [place] in regards to travel but his paperchart was reviewed and he needs to have his Pneumo vaccines - a phone call was placed to have staff call patient from him to have this done.  Patient verbalized to me that they understand the following: diagnosis, what is being done for them, what to expect and what should be done at home.  Their questions have been answered.  See after visit summary for patient specific instructions.  Windell Hummingbird PA-C  [facility] 04/12/2018 11:12 AM  Please note: Portions of this report may have been transcribed using dragon voice recognition software. Every effort was made to  ensure accuracy; however, inadvertent computerized transcription errors may be present.

## 2018-04-12 NOTE — Patient Instructions (Addendum)
   [facility] [facility], [place], [place] [address] Phone: [phone]   We will switch to a different topical medication - you will put it on your face (the areas that are red and dry) at night - hopefully this will work better.    In the am use a facial moisturizer - aveeno ultra calming face cream  If you are going outside please make sure you wear sunscreen. Or wear a hat that is a great way to protect your face from the sun.   If you have lab work done today you will be contacted with your lab results within the next 2 weeks.  If you have not heard from [place] then please contact us. The fastest way to get your results is to register for My Chart.   IF you received an x-ray today, you will receive an invoice from [facility]. Please contact [facility] at [phone] with questions or concerns regarding your invoice.   IF you received labwork today, you will receive an invoice from [facility]. Please contact [facility] at [phone] with questions or concerns regarding your invoice.   Our billing staff will not be able to assist you with questions regarding bills from these companies.  You will be contacted with the lab results as soon as they are available. The fastest way to get your results is to activate your My Chart account. Instructions are located on the last page of this paperwork. If you have not heard from [place] regarding the results in 2 weeks, please contact this office.

## 2018-04-13 ENCOUNTER — Telehealth: Payer: Self-pay

## 2018-04-13 NOTE — Telephone Encounter (Signed)
-----   Message from Mancel Bale, PA-C sent at 04/12/2018  9:39 AM EDT ----- I finally got his paper chart -- can we call the patient and let him know that he needs a prevnar 13 - I would get before he leaves for his trip to protect him in the airplane.  Judson Roch

## 2018-04-13 NOTE — Telephone Encounter (Signed)
Message from Herman sent to Scheduling Pool to schedule time for pt to get Prevnar 13 inj.

## 2018-04-16 ENCOUNTER — Telehealth: Payer: Self-pay | Admitting: Physician Assistant

## 2018-04-16 NOTE — Telephone Encounter (Signed)
Called pt. To schedule visit for prev 13 shot per Weber. Scheduled with [facility]

## 2018-04-19 ENCOUNTER — Telehealth: Payer: Self-pay | Admitting: Family Medicine

## 2018-04-19 NOTE — Telephone Encounter (Signed)
A Prior Auth was needed for the Metronidazole 1% topical gel tub. It came back from the insurance DENIED. Plan does cover the Metronidazole 0.75%.  Please advise   Thank you!

## 2018-04-22 ENCOUNTER — Other Ambulatory Visit: Payer: Self-pay

## 2018-04-22 ENCOUNTER — Encounter: Payer: Self-pay | Admitting: Physician Assistant

## 2018-04-22 ENCOUNTER — Ambulatory Visit (INDEPENDENT_AMBULATORY_CARE_PROVIDER_SITE_OTHER): Payer: Medicare Other | Admitting: Physician Assistant

## 2018-04-22 VITALS — BP 108/60 | HR 66 | Temp 97.9°F | Resp 16 | Ht 61.0 in | Wt 152.8 lb

## 2018-04-22 DIAGNOSIS — Z7189 Other specified counseling: Secondary | ICD-10-CM

## 2018-04-22 DIAGNOSIS — Z23 Encounter for immunization: Secondary | ICD-10-CM

## 2018-04-22 NOTE — Progress Notes (Signed)
   Lee [name]  MRN: [id number] DOB: April 14, 1946  PCP: Lee Pollen, MD  Subjective:  Pt is a 72 year old male who presents to clinic for immunization. He is a former pt of Lee [name] who scheduled this appointment for him. He needs prevnar 13.  He is traveling to [place]  Feeling well today  Review of Systems  Constitutional: Negative for chills and fever.  Respiratory: Negative for cough, chest tightness, shortness of breath and wheezing.   Cardiovascular: Negative for chest pain and palpitations.    There are no active problems to display for this patient.   Current Outpatient Medications on File Prior to Visit  Medication Sig Dispense Refill  . metroNIDAZOLE (METROGEL) 1 % gel Apply topically daily.    Marland Kitchen pyridOXINE (VITAMIN B-6) 100 MG tablet Take 100 mg by mouth daily. Reported on 11/02/2015     No current facility-administered medications on file prior to visit.     No Known Allergies   Objective:  BP 108/60 (BP Location: Left Arm, Patient Position: Sitting, Cuff Size: Normal)   Pulse 66   Temp 97.9 F (36.6 C) (Oral)   Resp 16   Ht 5\' 1"  (1.549 m)   Wt 152 lb 12.8 oz (69.3 kg)   SpO2 98%   BMI 28.87 kg/m   Physical Exam  Constitutional: He is oriented to person, place, and time. He appears well-developed and well-nourished.  Cardiovascular: Normal rate and regular rhythm.  Pulmonary/Chest: Effort normal. No respiratory distress.  Neurological: He is alert and oriented to person, place, and time.  Skin: Skin is warm and dry.  Psychiatric: He has a normal mood and affect. His behavior is normal. Judgment and thought content normal.  Vitals reviewed.   Assessment and Plan :  1. Encounter for immunization 2. Need for 23-polyvalent pneumococcal polysaccharide vaccine - Pt is a 72 year old male who presents to clinic for immunization. He is a former pt of Lee [name] who scheduled this appointment for him. He needs prevnar 13 - however called pt's  pharmacy to confirm and evidently Lee [name] received prevnar 13 on 08/2016. CMA administered his pneumococcal 23-valent today. He is traveling to [place] soon.  - Pneumococcal polysaccharide vaccine 23-valent greater than or equal to 2yo subcutaneous/IM  Mercer Pod, PA-C  [facility] 04/22/2018 9:08 AM  Please note: Portions of this report may have been transcribed using dragon voice recognition software. Every effort was made to ensure accuracy; however, inadvertent computerized transcription errors may be present.

## 2018-04-22 NOTE — Patient Instructions (Addendum)
If you have lab work done today you will be contacted with your lab results within the next 2 weeks.  If you have not heard from [place] then please contact us. The fastest way to get your results is to register for My Chart.   IF you received an x-ray today, you will receive an invoice from [facility]. Please contact [facility] at [phone] with questions or concerns regarding your invoice.   IF you received labwork today, you will receive an invoice from [facility]. Please contact [facility] at [phone] with questions or concerns regarding your invoice.   Our billing staff will not be able to assist you with questions regarding bills from these companies.  You will be contacted with the lab results as soon as they are available. The fastest way to get your results is to activate your My Chart account. Instructions are located on the last page of this paperwork. If you have not heard from [place] regarding the results in 2 weeks, please contact this office.      Today you received your second and final pneumonia vaccine. You are finished with this vaccination series.  Please follow-up with your pharmacy to find out if you need any more Herpes Zoster vaccinations.   Have a great trip!   Pneumococcal Conjugate Vaccine suspension for injection What is this medicine? PNEUMOCOCCAL VACCINE (NEU mo KOK al vak SEEN) is a vaccine used to prevent pneumococcus bacterial infections. These bacteria can cause serious infections like pneumonia, meningitis, and blood infections. This vaccine will lower your chance of getting pneumonia. If you do get pneumonia, it can make your symptoms milder and your illness shorter. This vaccine will not treat an infection and will not cause infection. This vaccine is recommended for infants and young children, adults with certain medical conditions, and adults 75 years or older. This medicine may be used for other purposes; ask your health care  provider or pharmacist if you have questions. COMMON BRAND NAME(S): Prevnar, Prevnar 13 What should I tell my health care provider before I take this medicine? They need to know if you have any of these conditions: -bleeding problems -fever -immune system problems -an unusual or allergic reaction to pneumococcal vaccine, diphtheria toxoid, other vaccines, latex, other medicines, foods, dyes, or preservatives -pregnant or trying to get pregnant -breast-feeding How should I use this medicine? This vaccine is for injection into a muscle. It is given by a health care professional. A copy of Vaccine Information Statements will be given before each vaccination. Read this sheet carefully each time. The sheet may change frequently. Talk to your pediatrician regarding the use of this medicine in children. While this drug may be prescribed for children as young as 53 weeks old for selected conditions, precautions do apply. Overdosage: If you think you have taken too much of this medicine contact a poison control center or emergency room at once. NOTE: This medicine is only for you. Do not share this medicine with others. What if I miss a dose? It is important not to miss your dose. Call your doctor or health care professional if you are unable to keep an appointment. What may interact with this medicine? -medicines for cancer chemotherapy -medicines that suppress your immune function -steroid medicines like prednisone or cortisone This list may not describe all possible interactions. Give your health care provider a list of all the medicines, herbs, non-prescription drugs, or dietary supplements you use. Also tell them if you smoke, drink alcohol,  or use illegal drugs. Some items may interact with your medicine. What should I watch for while using this medicine? Mild fever and pain should go away in 3 days or less. Report any unusual symptoms to your doctor or health care professional. What side  effects may I notice from receiving this medicine? Side effects that you should report to your doctor or health care professional as soon as possible: -allergic reactions like skin rash, itching or hives, swelling of the face, lips, or tongue -breathing problems -confused -fast or irregular heartbeat -fever over 102 degrees F -seizures -unusual bleeding or bruising -unusual muscle weakness Side effects that usually do not require medical attention (report to your doctor or health care professional if they continue or are bothersome): -aches and pains -diarrhea -fever of 102 degrees F or less -headache -irritable -loss of appetite -pain, tender at site where injected -trouble sleeping This list may not describe all possible side effects. Call your doctor for medical advice about side effects. You may report side effects to FDA at 1-800-FDA-1088. Where should I keep my medicine? This does not apply. This vaccine is given in a clinic, pharmacy, doctor's office, or other health care setting and will not be stored at home. NOTE: This sheet is a summary. It may not cover all possible information. If you have questions about this medicine, talk to your doctor, pharmacist, or health care provider.  2018 Elsevier/Gold Standard (2014-04-30 10:27:27)

## 2018-04-23 NOTE — Telephone Encounter (Signed)
When did I prescribe this and what for? Thanks.

## 2018-04-23 NOTE — Telephone Encounter (Signed)
Please advise and change meds if possible

## 2018-04-23 NOTE — Telephone Encounter (Signed)
It looks like Judson Roch prescribed it for Acne rosacea will send to dr Pamella Pert

## 2018-04-24 MED ORDER — METRONIDAZOLE 0.75 % EX GEL
1.0000 | Freq: Two times a day (BID) | CUTANEOUS | 1 refills | Status: DC
Start: 2018-04-24 — End: 2018-05-13

## 2018-04-24 NOTE — Telephone Encounter (Signed)
Medication sent.

## 2018-05-13 ENCOUNTER — Encounter: Payer: Self-pay | Admitting: Family Medicine

## 2018-05-13 ENCOUNTER — Ambulatory Visit (INDEPENDENT_AMBULATORY_CARE_PROVIDER_SITE_OTHER): Payer: Medicare Other | Admitting: Family Medicine

## 2018-05-13 ENCOUNTER — Other Ambulatory Visit: Payer: Self-pay

## 2018-05-13 VITALS — BP 118/59 | HR 60 | Temp 98.0°F | Resp 16 | Ht 62.21 in | Wt 150.6 lb

## 2018-05-13 DIAGNOSIS — R739 Hyperglycemia, unspecified: Secondary | ICD-10-CM | POA: Diagnosis not present

## 2018-05-13 DIAGNOSIS — L719 Rosacea, unspecified: Secondary | ICD-10-CM | POA: Diagnosis not present

## 2018-05-13 DIAGNOSIS — Z125 Encounter for screening for malignant neoplasm of prostate: Secondary | ICD-10-CM

## 2018-05-13 DIAGNOSIS — Z131 Encounter for screening for diabetes mellitus: Secondary | ICD-10-CM | POA: Diagnosis not present

## 2018-05-13 DIAGNOSIS — Z630 Problems in relationship with spouse or partner: Secondary | ICD-10-CM

## 2018-05-13 DIAGNOSIS — Z0000 Encounter for general adult medical examination without abnormal findings: Secondary | ICD-10-CM | POA: Diagnosis not present

## 2018-05-13 DIAGNOSIS — Z13220 Encounter for screening for lipoid disorders: Secondary | ICD-10-CM

## 2018-05-13 MED ORDER — METRONIDAZOLE 0.75 % EX GEL: 1.0000 "application " | Freq: Two times a day (BID) | CUTANEOUS | 6 refills | Status: DC

## 2018-05-13 NOTE — Patient Instructions (Addendum)
Check with your pharmacy to determine which shingles vaccine you had. If not shingrix, then I would recommend getting that vaccine.   PSA drawn today for prostate cancer screening.   See info on living will. Please let us know if there are questions on completing that paperwork.   Blood sugar elevated in past - I will check that and other blood work today.   Please meet with relationship counselor to discuss concerns further and follow up in 6-8 weeks to review their discussion.  [place] [phone]  Continue metronidazole for rosacea.   Return to the clinic or go to the nearest emergency room if any of your symptoms worsen or new symptoms occur.    Rosacea Rosacea is a long-term (chronic) condition that affects the skin of the face, including the cheeks, nose, brow, and chin. This condition can also affect the eyes. Rosacea causes blood vessels near the surface of the skin to enlarge, which results in redness. What are the causes? The cause of this condition is not known. Certain triggers can make rosacea worse, including:  Hot baths.  Exercise.  Sunlight.  Very hot or cold temperatures.  Hot or spicy foods and drinks.  Drinking alcohol.  Stress.  Taking blood pressure medicine.  Long-term use of topical steroids on the face.  What increases the risk? This condition is more likely to develop in:  People who are older than 71 years of age.  Women.  People who have light-colored skin (light complexion).  People who have a family history of rosacea.  What are the signs or symptoms? Symptoms of this condition include:  Redness of the face.  Red bumps or pimples on the face.  A red, enlarged nose.  Blushing easily.  Red lines on the skin.  Irritated or burning feeling in the eyes.  Swollen eyelids.  How is this diagnosed? This condition is diagnosed with a medical history and physical exam. How is this treated? There is no  cure for this condition, but treatment can help to control your symptoms. Your health care provider may recommend that you see a skin specialist (dermatologist). Treatment may include:  Antibiotic medicines that are applied to the skin or taken as a pill.  Laser treatment to improve the appearance of the skin.  Surgery. This is rare.  Your health care provider will also recommend the best way to take care of your skin. Even after your skin improves, you will likely need to continue treatment to prevent your rosacea from coming back. Follow these instructions at home: Skin Care Take care of your skin as told by your health care provider. You may be told to do these things:  Wash your skin gently two or more times each day.  Use mild soap.  Use a sunscreen or sunblock with SPF 30 or greater.  Use gentle cosmetics that are meant for sensitive skin.  Shave with an electric shaver instead of a blade.  Lifestyle  Try to keep track of what foods trigger this condition. Avoid any triggers. These may include: ? Spicy foods. ? Seafood. ? Cheese. ? Hot liquids. ? Nuts. ? Chocolate. ? Iodized salt.  Do not drink alcohol.  Avoid extremely cold or hot temperatures.  Try to reduce your stress. If you need help, talk with your health care provider.  When you exercise, do these things to stay cool: ? Limit your sun exposure. ? Use a fan. ? Do shorter and more frequent intervals of exercise. General  instructions  Keep all follow-up visits as told by your health care provider. This is important.  Take over-the-counter and prescription medicines only as told by your health care provider.  If your eyelids are affected, apply warm compresses to them. Do this as told by your health care provider.  If you were prescribed an antibiotic medicine, apply or take it as told by your health care provider. Do not stop using the antibiotic even if your condition improves. Contact a health care  provider if:  Your symptoms get worse.  Your symptoms do not improve after two months of treatment.  You have new symptoms.  You have any changes in vision or you have problems with your eyes, such as redness or itching.  You feel depressed.  You lose your appetite.  You have trouble concentrating. This information is not intended to replace advice given to you by your health care provider. Make sure you discuss any questions you have with your health care provider. Document Released: 08/31/2004 Document Revised: 12/30/2015 Document Reviewed: 09/30/2014 Elsevier Interactive Patient Education  2018 [place] 65 Years and Older, Male Preventive care refers to lifestyle choices and visits with your health care provider that can promote health and wellness. What does preventive care include?  A yearly physical exam. This is also called an annual well check.  Dental exams once or twice a year.  Routine eye exams. Ask your health care provider how often you should have your eyes checked.  Personal lifestyle choices, including: ? Daily care of your teeth and gums. ? Regular physical activity. ? Eating a healthy diet. ? Avoiding tobacco and drug use. ? Limiting alcohol use. ? Practicing safe sex. ? Taking low doses of aspirin every day. ? Taking vitamin and mineral supplements as recommended by your health care provider. What happens during an annual well check? The services and screenings done by your health care provider during your annual well check will depend on your age, overall health, lifestyle risk factors, and family history of disease. Counseling Your health care provider may ask you questions about your:  Alcohol use.  Tobacco use.  Drug use.  Emotional well-being.  Home and relationship well-being.  Sexual activity.  Eating habits.  History of falls.  Memory and ability to understand (cognition).  Work and work  Statistician.  Screening You may have the following tests or measurements:  Height, weight, and BMI.  Blood pressure.  Lipid and cholesterol levels. These may be checked every 5 years, or more frequently if you are over 69 years old.  Skin check.  Lung cancer screening. You may have this screening every year starting at age 20 if you have a 30-pack-year history of smoking and currently smoke or have quit within the past 15 years.  Fecal occult blood test (FOBT) of the stool. You may have this test every year starting at age 60.  Flexible sigmoidoscopy or colonoscopy. You may have a sigmoidoscopy every 5 years or a colonoscopy every 10 years starting at age 83.  Prostate cancer screening. Recommendations will vary depending on your family history and other risks.  Hepatitis C blood test.  Hepatitis B blood test.  Sexually transmitted disease (STD) testing.  Diabetes screening. This is done by checking your blood sugar (glucose) after you have not eaten for a while (fasting). You may have this done every 1-3 years.  Abdominal aortic aneurysm (AAA) screening. You may need this if you are a current or former  smoker.  Osteoporosis. You may be screened starting at age 59 if you are at high risk.  Talk with your health care provider about your test results, treatment options, and if necessary, the need for more tests. Vaccines Your health care provider may recommend certain vaccines, such as:  Influenza vaccine. This is recommended every year.  Tetanus, diphtheria, and acellular pertussis (Tdap, Td) vaccine. You may need a Td booster every 10 years.  Varicella vaccine. You may need this if you have not been vaccinated.  Zoster vaccine. You may need this after age 10.  Measles, mumps, and rubella (MMR) vaccine. You may need at least one dose of MMR if you were born in 1957 or later. You may also need a second dose.  Pneumococcal 13-valent conjugate (PCV13) vaccine. One dose is  recommended after age 89.  Pneumococcal polysaccharide (PPSV23) vaccine. One dose is recommended after age 82.  Meningococcal vaccine. You may need this if you have certain conditions.  Hepatitis A vaccine. You may need this if you have certain conditions or if you travel or work in places where you may be exposed to hepatitis A.  Hepatitis B vaccine. You may need this if you have certain conditions or if you travel or work in places where you may be exposed to hepatitis B.  Haemophilus influenzae type b (Hib) vaccine. You may need this if you have certain risk factors.  Talk to your health care provider about which screenings and vaccines you need and how often you need them. This information is not intended to replace advice given to you by your health care provider. Make sure you discuss any questions you have with your health care provider. Document Released: 08/20/2015 Document Revised: 04/12/2016 Document Reviewed: 05/25/2015 Elsevier Interactive Patient Education  [employer].      If you have lab work done today you will be contacted with your lab results within the next 2 weeks.  If you have not heard from [place] then please contact us. The fastest way to get your results is to register for My Chart.   IF you received an x-ray today, you will receive an invoice from [facility]. Please contact [facility] at [phone] with questions or concerns regarding your invoice.   IF you received labwork today, you will receive an invoice from [place]. Please contact [facility] at [phone] with questions or concerns regarding your invoice.   Our billing staff will not be able to assist you with questions regarding bills from these companies.  You will be contacted with the lab results as soon as they are available. The fastest way to get your results is to activate your My Chart account. Instructions are located on the last page of this paperwork. If you have  not heard from [place] regarding the results in 2 weeks, please contact this office.

## 2018-05-13 NOTE — Progress Notes (Signed)
Subjective:  By signing my name below, I, Essence Howell, attest that this documentation has been prepared under the direction and in the presence of Wendie Agreste, MD Electronically Signed: Ladene Artist, ED Scribe 05/13/2018 at 8:44 AM.   Patient ID: Lee [name], male    DOB: Sep 25, 1945, 72 y.o.   MRN: [id number]  Chief Complaint  Patient presents with  . Medicare Wellness   HPI Lee [name] is a 72 y.o. male who presents to Primary Care at [facility] for a medicare wellness exam.   CA Screening Colonoscopy: 02/17/15, rpt 5 yrs Prostate CA Screening: I do not see a prev PSA. - Pt would like to do the PSA only; declines DRE. No results found for: PSA  Immunizations Immunization History  Administered Date(s) Administered  . Influenza Split 04/08/2015  . Influenza, High Dose Seasonal PF 04/12/2018  . Influenza-Unspecified 04/07/2017  . Pneumococcal Conjugate-13 08/10/2016  . Pneumococcal Polysaccharide-23 04/22/2018  Shingrix: pt received Zoster vaccine ~4 yrs ago; new one to sent pharmacy  Fall Screening No falls within the past yr.   Depression Screening Depression screen [facility] 2/9 05/13/2018 05/13/2018 04/22/2018 04/22/2018 08/04/2017  Decreased Interest 0 0 0 0 0  Down, Depressed, Hopeless 0 0 0 0 0  PHQ - 2 Score 0 0 0 0 0    Functional Status Survey: Is the patient deaf or have difficulty hearing?: No Does the patient have difficulty seeing, even when wearing glasses/contacts?: No Does the patient have difficulty concentrating, remembering, or making decisions?: No Does the patient have difficulty walking or climbing stairs?: No Does the patient have difficulty dressing or bathing?: No Does the patient have difficulty doing errands alone such as visiting a doctor's office or shopping?: No  Mental Status Screening 6CIT Screen 05/13/2018  What Year? 0 points  What month? 0 points  What time? 0 points  Count back from 20 0 points  Months in reverse 0 points    Repeat phrase 0 points  Total Score 0     Visual Acuity Screening   Right eye Left eye Both eyes  Without correction:     With correction: '20/20 20/20 20/20 '   Dentist: pt is not followed by a dentist; has dentures Exercise: walks daily for exercise  Advanced Directives  Does not have advanced directive.  H/o Rosacea Seen in Sept. Prev took clindamycin, prescribed Metrogel 1% in Sept. - Pt has been using Metrogel bid which he states has improved rash. He denies any side-effects.  Hyperglycemia Noted on prev blood work. No A1C.  Wife reports pt's personality has changed recently which has caused a lot of arguments. States he wants to be alone, not wanting to communicate with people, even their grandchildren. Wife is concerned that he likes to sit in the dark; concerned he may be depressed. However, pt states that he'd rather stay indoors since retiring in 07/2016 then going out and spending money. Pt denies feeling depressed, SI/HI, h/o depression.  There are no active problems to display for this patient.  Past Medical History:  Diagnosis Date  . Kidney stones    History reviewed. No pertinent surgical history. No Known Allergies Prior to Admission medications   Medication Sig Start Date End Date Taking? Authorizing Provider  metroNIDAZOLE (METROGEL) 0.75 % gel Apply 1 application topically 2 (two) times daily. 04/24/18   Rutherford Guys, MD  pyridOXINE (VITAMIN B-6) 100 MG tablet Take 100 mg by mouth daily. Reported on 11/02/2015    [provider]  Social History   Socioeconomic History  . Marital status: Unknown    Spouse name: Not on file  . Number of children: 2  . Years of education: Not on file  . Highest education level: Not on file  Occupational History  . Occupation: resturant  Social Needs  . Financial resource strain: Not on file  . Food insecurity:    Worry: Not on file    Inability: Not on file  . Transportation needs:    Medical: Not on  file    Non-medical: Not on file  Tobacco Use  . Smoking status: Never Smoker  . Smokeless tobacco: Never Used  Substance and Sexual Activity  . Alcohol use: No    Alcohol/week: 0.0 standard drinks  . Drug use: No  . Sexual activity: Not on file  Lifestyle  . Physical activity:    Days per week: Not on file    Minutes per session: Not on file  . Stress: Not on file  Relationships  . Social connections:    Talks on phone: Not on file    Gets together: Not on file    Attends religious service: Not on file    Active member of club or organization: Not on file    Attends meetings of clubs or organizations: Not on file    Relationship status: Not on file  . Intimate partner violence:    Fear of current or ex partner: Not on file    Emotionally abused: Not on file    Physically abused: Not on file    Forced sexual activity: Not on file  Other Topics Concern  . Not on file  Social History Narrative  . Not on file   Review of Systems  Skin: Positive for rash.  Psychiatric/Behavioral: Negative for dysphoric mood and suicidal ideas.  All other systems reviewed and are negative.     Objective:   Physical Exam  Constitutional: He is oriented to person, place, and time. He appears well-developed and well-nourished.  HENT:  Head: Normocephalic and atraumatic.  Right Ear: External ear normal.  Left Ear: External ear normal.  Mouth/Throat: Oropharynx is clear and moist.  Eyes: Pupils are equal, round, and reactive to light. Conjunctivae and EOM are normal.  Neck: Normal range of motion. Neck supple. No thyromegaly present.  Cardiovascular: Normal rate, regular rhythm, normal heart sounds and intact distal pulses.  Pulmonary/Chest: Effort normal and breath sounds normal. No respiratory distress. He has no wheezes.  Abdominal: Soft. He exhibits no distension. There is no tenderness.  Musculoskeletal: Normal range of motion. He exhibits no edema or tenderness.  Lymphadenopathy:     He has no cervical adenopathy.  Neurological: He is alert and oriented to person, place, and time. He has normal reflexes.  Skin: Skin is warm and dry. Rash noted. Rash is papular.  Few very faint papules on the malar prominence.  Psychiatric: He has a normal mood and affect. His behavior is normal.  Vitals reviewed.  Vitals:   05/13/18 0813  BP: (!) 118/59  Pulse: 60  Resp: 16  Temp: 98 F (36.7 C)  TempSrc: Oral  SpO2: 97%  Weight: 150 lb 9.6 oz (68.3 kg)  Height: 5' 2.21" (1.58 m)      Assessment & Plan:    Lee [name] is a 72 y.o. male Medicare annual wellness visit, subsequent  -  - anticipatory guidance as below in AVS, screening labs if needed. Health maintenance items as above in HPI discussed/recommended as  applicable.   - no concerning responses on depression, fall, or functional status screening. Any positive responses noted as above. Advanced directives discussed as in CH[facility].   Rosacea - Plan: metroNIDAZOLE (METROGEL) 0.75 % gel  -  Stable, tolerating current regimen. Medications refilled.   Screening for prostate cancer - Plan: PSA  - We discussed pros and cons of prostate cancer screening, and after this discussion, he chose to have screening done with PSA alone. limitations with partial testing discussed.    Hyperglycemia - Plan: Hemoglobin A1c, Comprehensive metabolic panel Screening for diabetes mellitus - Plan: Hemoglobin A1c  - noted on prior labs. Screen for DM.   Screening for hyperlipidemia - Plan: Lipid panel  Marital/partner relational problem  -Spouse concerned about patient, depression or anxiety symptoms, admits to being happy.  Suspect there is been some adjustment with him retiring and being at home more.  Dementia screening reassuring.   -  Will start with meeting with marital therapist/counselor, and follow-up in 6 weeks to determine if other evaluation needed.  Meds ordered this encounter  Medications  . metroNIDAZOLE (METROGEL) 0.75 %  gel    Sig: Apply 1 application topically 2 (two) times daily.    Dispense:  45 g    Refill:  6   Patient Instructions   Check with your pharmacy to determine which shingles vaccine you had. If not shingrix, then I would recommend getting that vaccine.   PSA drawn today for prostate cancer screening.   See info on living will. Please let us know if there are questions on completing that paperwork.   Blood sugar elevated in past - I will check that and other blood work today.   Please meet with relationship counselor to discuss concerns further and follow up in 6-8 weeks to review their discussion.  [place] [phone]  Continue metronidazole for rosacea.   Return to the clinic or go to the nearest emergency room if any of your symptoms worsen or new symptoms occur.    Rosacea Rosacea is a long-term (chronic) condition that affects the skin of the face, including the cheeks, nose, brow, and chin. This condition can also affect the eyes. Rosacea causes blood vessels near the surface of the skin to enlarge, which results in redness. What are the causes? The cause of this condition is not known. Certain triggers can make rosacea worse, including:  Hot baths.  Exercise.  Sunlight.  Very hot or cold temperatures.  Hot or spicy foods and drinks.  Drinking alcohol.  Stress.  Taking blood pressure medicine.  Long-term use of topical steroids on the face.  What increases the risk? This condition is more likely to develop in:  People who are older than 71 years of age.  Women.  People who have light-colored skin (light complexion).  People who have a family history of rosacea.  What are the signs or symptoms? Symptoms of this condition include:  Redness of the face.  Red bumps or pimples on the face.  A red, enlarged nose.  Blushing easily.  Red lines on the skin.  Irritated or burning feeling in the eyes.  Swollen eyelids.  How  is this diagnosed? This condition is diagnosed with a medical history and physical exam. How is this treated? There is no cure for this condition, but treatment can help to control your symptoms. Your health care provider may recommend that you see a skin specialist (dermatologist). Treatment may include:  Antibiotic medicines that are applied to  the skin or taken as a pill.  Laser treatment to improve the appearance of the skin.  Surgery. This is rare.  Your health care provider will also recommend the best way to take care of your skin. Even after your skin improves, you will likely need to continue treatment to prevent your rosacea from coming back. Follow these instructions at home: Skin Care Take care of your skin as told by your health care provider. You may be told to do these things:  Wash your skin gently two or more times each day.  Use mild soap.  Use a sunscreen or sunblock with SPF 30 or greater.  Use gentle cosmetics that are meant for sensitive skin.  Shave with an electric shaver instead of a blade.  Lifestyle  Try to keep track of what foods trigger this condition. Avoid any triggers. These may include: ? Spicy foods. ? Seafood. ? Cheese. ? Hot liquids. ? Nuts. ? Chocolate. ? Iodized salt.  Do not drink alcohol.  Avoid extremely cold or hot temperatures.  Try to reduce your stress. If you need help, talk with your health care provider.  When you exercise, do these things to stay cool: ? Limit your sun exposure. ? Use a fan. ? Do shorter and more frequent intervals of exercise. General instructions  Keep all follow-up visits as told by your health care provider. This is important.  Take over-the-counter and prescription medicines only as told by your health care provider.  If your eyelids are affected, apply warm compresses to them. Do this as told by your health care provider.  If you were prescribed an antibiotic medicine, apply or take it as  told by your health care provider. Do not stop using the antibiotic even if your condition improves. Contact a health care provider if:  Your symptoms get worse.  Your symptoms do not improve after two months of treatment.  You have new symptoms.  You have any changes in vision or you have problems with your eyes, such as redness or itching.  You feel depressed.  You lose your appetite.  You have trouble concentrating. This information is not intended to replace advice given to you by your health care provider. Make sure you discuss any questions you have with your health care provider. Document Released: 08/31/2004 Document Revised: 12/30/2015 Document Reviewed: 09/30/2014 Elsevier Interactive Patient Education  2018 [place] 65 Years and Older, Male Preventive care refers to lifestyle choices and visits with your health care provider that can promote health and wellness. What does preventive care include?  A yearly physical exam. This is also called an annual well check.  Dental exams once or twice a year.  Routine eye exams. Ask your health care provider how often you should have your eyes checked.  Personal lifestyle choices, including: ? Daily care of your teeth and gums. ? Regular physical activity. ? Eating a healthy diet. ? Avoiding tobacco and drug use. ? Limiting alcohol use. ? Practicing safe sex. ? Taking low doses of aspirin every day. ? Taking vitamin and mineral supplements as recommended by your health care provider. What happens during an annual well check? The services and screenings done by your health care provider during your annual well check will depend on your age, overall health, lifestyle risk factors, and family history of disease. Counseling Your health care provider may ask you questions about your:  Alcohol use.  Tobacco use.  Drug use.  Emotional well-being.  Home and relationship well-being.  Sexual  activity.  Eating habits.  History of falls.  Memory and ability to understand (cognition).  Work and work Statistician.  Screening You may have the following tests or measurements:  Height, weight, and BMI.  Blood pressure.  Lipid and cholesterol levels. These may be checked every 5 years, or more frequently if you are over 36 years old.  Skin check.  Lung cancer screening. You may have this screening every year starting at age 62 if you have a 30-pack-year history of smoking and currently smoke or have quit within the past 15 years.  Fecal occult blood test (FOBT) of the stool. You may have this test every year starting at age 23.  Flexible sigmoidoscopy or colonoscopy. You may have a sigmoidoscopy every 5 years or a colonoscopy every 10 years starting at age 27.  Prostate cancer screening. Recommendations will vary depending on your family history and other risks.  Hepatitis C blood test.  Hepatitis B blood test.  Sexually transmitted disease (STD) testing.  Diabetes screening. This is done by checking your blood sugar (glucose) after you have not eaten for a while (fasting). You may have this done every 1-3 years.  Abdominal aortic aneurysm (AAA) screening. You may need this if you are a current or former smoker.  Osteoporosis. You may be screened starting at age 66 if you are at high risk.  Talk with your health care provider about your test results, treatment options, and if necessary, the need for more tests. Vaccines Your health care provider may recommend certain vaccines, such as:  Influenza vaccine. This is recommended every year.  Tetanus, diphtheria, and acellular pertussis (Tdap, Td) vaccine. You may need a Td booster every 10 years.  Varicella vaccine. You may need this if you have not been vaccinated.  Zoster vaccine. You may need this after age 29.  Measles, mumps, and rubella (MMR) vaccine. You may need at least one dose of MMR if you were born in  1957 or later. You may also need a second dose.  Pneumococcal 13-valent conjugate (PCV13) vaccine. One dose is recommended after age 41.  Pneumococcal polysaccharide (PPSV23) vaccine. One dose is recommended after age 66.  Meningococcal vaccine. You may need this if you have certain conditions.  Hepatitis A vaccine. You may need this if you have certain conditions or if you travel or work in places where you may be exposed to hepatitis A.  Hepatitis B vaccine. You may need this if you have certain conditions or if you travel or work in places where you may be exposed to hepatitis B.  Haemophilus influenzae type b (Hib) vaccine. You may need this if you have certain risk factors.  Talk to your health care provider about which screenings and vaccines you need and how often you need them. This information is not intended to replace advice given to you by your health care provider. Make sure you discuss any questions you have with your health care provider. Document Released: 08/20/2015 Document Revised: 04/12/2016 Document Reviewed: 05/25/2015 Elsevier Interactive Patient Education  [employer].      If you have lab work done today you will be contacted with your lab results within the next 2 weeks.  If you have not heard from [place] then please contact us. The fastest way to get your results is to register for My Chart.   IF you received an x-ray today, you will receive an invoice from [facility]. Please contact  [facility] at [phone] with questions or concerns regarding your invoice.   IF you received labwork today, you will receive an invoice from [facility]. Please contact [facility] at [phone] with questions or concerns regarding your invoice.   Our billing staff will not be able to assist you with questions regarding bills from these companies.  You will be contacted with the lab results as soon as they are available. The fastest way to get your results  is to activate your My Chart account. Instructions are located on the last page of this paperwork. If you have not heard from [place] regarding the results in 2 weeks, please contact this office.       I personally performed the services described in this documentation, which was scribed in my presence. The recorded information has been reviewed and considered for accuracy and completeness, addended by me as needed, and agree with information above.  Signed,   Merri Ray, MD [facility] at [facility].  05/13/18 10:04 AM

## 2018-05-14 LAB — COMPREHENSIVE METABOLIC PANEL
A/G RATIO: 1.9 (ref 1.2–2.2)
ALBUMIN: 4.7 g/dL (ref 3.5–4.8)
ALT: 18 IU/L (ref 0–44)
AST: 20 IU/L (ref 0–40)
Alkaline Phosphatase: 63 IU/L (ref 39–117)
BILIRUBIN TOTAL: 0.6 mg/dL (ref 0.0–1.2)
BUN / CREAT RATIO: 20 (ref 10–24)
BUN: 15 mg/dL (ref 8–27)
CALCIUM: 9.4 mg/dL (ref 8.6–10.2)
CO2: 23 mmol/L (ref 20–29)
Chloride: 102 mmol/L (ref 96–106)
Creatinine, Ser: 0.76 mg/dL (ref 0.76–1.27)
GFR calc non Af Amer: 92 mL/min/{1.73_m2} (ref >59)
GFR, EST AFRICAN AMERICAN: 106 mL/min/{1.73_m2} (ref >59)
GLOBULIN, TOTAL: 2.5 g/dL (ref 1.5–4.5)
Glucose: 100 mg/dL — ABNORMAL HIGH (ref 65–99)
POTASSIUM: 4.3 mmol/L (ref 3.5–5.2)
SODIUM: 140 mmol/L (ref 134–144)
TOTAL PROTEIN: 7.2 g/dL (ref 6.0–8.5)

## 2018-05-14 LAB — HEMOGLOBIN A1C
Est. average glucose Bld gHb Est-mCnc: 120 mg/dL
Hgb A1c MFr Bld: 5.8 % — ABNORMAL HIGH (ref 4.8–5.6)

## 2018-05-14 LAB — LIPID PANEL
CHOL/HDL RATIO: 4.2 ratio (ref 0.0–5.0)
CHOLESTEROL TOTAL: 245 mg/dL — AB (ref 100–199)
HDL: 58 mg/dL (ref >39)
LDL Calculated: 160 mg/dL — ABNORMAL HIGH (ref 0–99)
TRIGLYCERIDES: 135 mg/dL (ref 0–149)
VLDL Cholesterol Cal: 27 mg/dL (ref 5–40)

## 2018-05-14 LAB — PSA: Prostate Specific Ag, Serum: 2.6 ng/mL (ref 0.0–4.0)

## 2018-05-21 ENCOUNTER — Encounter: Payer: Self-pay | Admitting: *Deleted

## 2018-06-25 ENCOUNTER — Ambulatory Visit: Payer: Medicare Other | Admitting: Family Medicine

## 2019-04-09 DIAGNOSIS — Z23 Encounter for immunization: Secondary | ICD-10-CM | POA: Diagnosis not present

## 2019-07-15 ENCOUNTER — Telehealth: Payer: Self-pay | Admitting: *Deleted

## 2019-07-15 NOTE — Telephone Encounter (Signed)
Schedule AWV.  

## 2019-10-03 ENCOUNTER — Ambulatory Visit: Payer: Medicare Other | Attending: Internal Medicine

## 2019-10-03 DIAGNOSIS — Z23 Encounter for immunization: Secondary | ICD-10-CM

## 2019-10-03 NOTE — Progress Notes (Signed)
   Covid-19 Vaccination Clinic  Name:  Lee [name]    MRN: [id number] DOB: 1945/08/30  10/03/2019  Lee [name] was observed post Covid-19 immunization for 15 minutes without incidence. He was provided with Vaccine Information Sheet and instruction to access the V-Safe system.   Lee [name] was instructed to call 911 with any severe reactions post vaccine: Marland Kitchen Difficulty breathing  . Swelling of your face and throat  . A fast heartbeat  . A bad rash all over your body  . Dizziness and weakness    Immunizations Administered    Name Date Dose VIS Date Route   Pfizer COVID-19 Vaccine 10/03/2019 10:28 AM 0.3 mL 07/18/2019 Intramuscular   Manufacturer: [place]   Lot: [id number]   [place]: [id number]

## 2019-10-14 ENCOUNTER — Telehealth: Payer: Self-pay | Admitting: *Deleted

## 2019-10-14 NOTE — Telephone Encounter (Signed)
Schedule awv  

## 2019-10-28 ENCOUNTER — Ambulatory Visit: Payer: Medicare Other | Attending: Internal Medicine

## 2019-10-28 DIAGNOSIS — Z23 Encounter for immunization: Secondary | ICD-10-CM

## 2019-10-28 NOTE — Progress Notes (Signed)
   Covid-19 Vaccination Clinic  Name:  Lee [name]    MRN: [id number] DOB: 1945/10/12  10/28/2019  [name] was observed post Covid-19 immunization for 15 minutes without incident. He was provided with Vaccine Information Sheet and instruction to access the V-Safe system.   [name] was instructed to call 911 with any severe reactions post vaccine: Marland Kitchen Difficulty breathing  . Swelling of face and throat  . A fast heartbeat  . A bad rash all over body  . Dizziness and weakness   Immunizations Administered    Name Date Dose VIS Date Route   Pfizer COVID-19 Vaccine 10/28/2019 11:54 AM 0.3 mL 07/18/2019 Intramuscular   Manufacturer: [place]   Lot: [id number]   [place]: [id number]

## 2019-11-13 NOTE — Telephone Encounter (Signed)
Schedule AWV.  

## 2020-01-19 ENCOUNTER — Encounter: Payer: Self-pay | Admitting: Gastroenterology

## 2020-02-19 ENCOUNTER — Encounter: Payer: Self-pay | Admitting: Emergency Medicine

## 2020-02-19 ENCOUNTER — Ambulatory Visit (INDEPENDENT_AMBULATORY_CARE_PROVIDER_SITE_OTHER): Payer: Medicare Other | Admitting: Emergency Medicine

## 2020-02-19 VITALS — BP 134/70 | HR 79 | Temp 97.5°F | Resp 14 | Ht 62.2 in | Wt 158.8 lb

## 2020-02-19 DIAGNOSIS — L03114 Cellulitis of left upper limb: Secondary | ICD-10-CM | POA: Diagnosis not present

## 2020-02-19 MED ORDER — CEFADROXIL 500 MG PO CAPS: 500.0000 mg | ORAL_CAPSULE | Freq: Two times a day (BID) | ORAL | 0 refills | Status: AC

## 2020-02-19 NOTE — Progress Notes (Signed)
Lee [name] 74 y.o.   Chief Complaint  Patient presents with  . Insect Bite    pt had a bee bite or sting happen on his neck and his Lt hand on tuesday states his hand is still very itchy though his neck does not bother him anymore, some swelling and redness on his hand denies pain    HISTORY OF PRESENT ILLNESS: This is a 74 y.o. male complaining of swelling and redness of left hand following possible insect sting couple days ago. Has full function of left hand.  No other associated symptoms. No other complaints or medical concerns.  HPI   Prior to Admission medications   Medication Sig Start Date End Date Taking? Authorizing Provider  metroNIDAZOLE (METROGEL) 0.75 % gel Apply 1 application topically 2 (two) times daily. 05/13/18  Yes Wendie Agreste, MD  pyridOXINE (VITAMIN B-6) 100 MG tablet Take 100 mg by mouth daily. Reported on 11/02/2015   Yes [provider]    No Known Allergies  There are no problems to display for this patient.   Past Medical History:  Diagnosis Date  . Kidney stones     No past surgical history on file.  Social History   Socioeconomic History  . Marital status: Unknown    Spouse name: Not on file  . Number of children: 2  . Years of education: Not on file  . Highest education level: Not on file  Occupational History  . Occupation: resturant  Tobacco Use  . Smoking status: Never Smoker  . Smokeless tobacco: Never Used  Substance and Sexual Activity  . Alcohol use: No    Alcohol/week: 0.0 standard drinks  . Drug use: No  . Sexual activity: Not on file  Other Topics Concern  . Not on file  Social History Narrative  . Not on file   Social Determinants of Health   Financial Resource Strain:   . Difficulty of Paying Living Expenses:   Food Insecurity:   . Worried About Charity fundraiser in the Last Year:   . Arboriculturist in the Last Year:   Transportation Needs:   . Film/video editor (Medical):   Marland Kitchen Lack  of Transportation (Non-Medical):   Physical Activity:   . Days of Exercise per Week:   . Minutes of Exercise per Session:   Stress:   . Feeling of Stress :   Social Connections:   . Frequency of Communication with Friends and Family:   . Frequency of Social Gatherings with Friends and Family:   . Attends Religious Services:   . Active Member of Clubs or Organizations:   . Attends Archivist Meetings:   Marland Kitchen Marital Status:   Intimate Partner Violence:   . Fear of Current or Ex-Partner:   . Emotionally Abused:   Marland Kitchen Physically Abused:   . Sexually Abused:     Family History  Problem Relation Age of Onset  . Stomach cancer Father   . Colon cancer Neg Hx      Review of Systems  Constitutional: Negative.  Negative for chills and fever.  HENT: Negative.  Negative for congestion and sore throat.   Respiratory: Negative.  Negative for cough and shortness of breath.   Cardiovascular: Negative.  Negative for chest pain and palpitations.  Gastrointestinal: Negative for abdominal pain, diarrhea, nausea and vomiting.  Skin: Positive for rash.  Neurological: Negative for dizziness and headaches.  All other systems reviewed and are negative.  Today's Vitals  02/19/20 1134  BP: 134/70  Pulse: 79  Resp: 14  Temp: (!) 97.5 F (36.4 C)  TempSrc: Temporal  SpO2: 100%  Weight: 158 lb 12.8 oz (72 kg)  Height: 5' 2.2" (1.58 m)   Body mass index is 28.86 kg/m.   Physical Exam Vitals reviewed.  Constitutional:      Appearance: Normal appearance.  HENT:     Head: Normocephalic.  Eyes:     Extraocular Movements: Extraocular movements intact.     Pupils: Pupils are equal, round, and reactive to light.  Cardiovascular:     Rate and Rhythm: Normal rate.  Pulmonary:     Effort: Pulmonary effort is normal.  Musculoskeletal:        General: Normal range of motion.     Cervical back: Normal range of motion.  Skin:    General: Skin is warm and dry.     Findings: Rash  present.     Comments: Left hand: Positive erythema and swelling, see picture below. Full range of motion and neurovascularly intact. No signs of tenosynovitis or septic arthritis.  Neurological:     General: No focal deficit present.     Mental Status: He is alert and oriented to person, place, and time.  Psychiatric:        Mood and Affect: Mood normal.        Behavior: Behavior normal.        ASSESSMENT & PLAN: Lee [name] was seen today for insect bite.  Diagnoses and all orders for this visit:  Cellulitis of left hand -     cefadroxil (DURICEF) 500 MG capsule; Take 1 capsule (500 mg total) by mouth 2 (two) times daily for 7 days.    Patient Instructions       If you have lab work done today you will be contacted with your lab results within the next 2 weeks.  If you have not heard from [place] then please contact us. The fastest way to get your results is to register for My Chart.   IF you received an x-ray today, you will receive an invoice from [facility]. Please contact [facility] at [phone] with questions or concerns regarding your invoice.   IF you received labwork today, you will receive an invoice from [place]. Please contact [facility] at [phone] with questions or concerns regarding your invoice.   Our billing staff will not be able to assist you with questions regarding bills from these companies.  You will be contacted with the lab results as soon as they are available. The fastest way to get your results is to activate your My Chart account. Instructions are located on the last page of this paperwork. If you have not heard from [place] regarding the results in 2 weeks, please contact this office.    Cellulitis Cellulitis, Adult  Cellulitis is a skin infection. The infected area is often warm, red, swollen, and sore. It occurs most often in the arms and lower legs. It is very important to get treated for this condition. What are the  causes? This condition is caused by bacteria. The bacteria enter through a break in the skin, such as a cut, burn, insect bite, open sore, or crack. What increases the risk? This condition is more likely to occur in people who:  Have a weak body defense system (immune system).  Have open cuts, burns, bites, or scrapes on the skin.  Are older than 73 years of age.  Have a blood sugar problem (  diabetes).  Have a long-lasting (chronic) liver disease (cirrhosis) or kidney disease.  Are very overweight (obese).  Have a skin problem, such as: ? Itchy rash (eczema). ? Slow movement of blood in the veins (venous stasis). ? Fluid buildup below the skin (edema).  Have been treated with high-energy rays (radiation).  Use IV drugs. What are the signs or symptoms? Symptoms of this condition include:  Skin that is: ? Red. ? Streaking. ? Spotting. ? Swollen. ? Sore or painful when you touch it. ? Warm.  A fever.  Chills.  Blisters. How is this diagnosed? This condition is diagnosed based on:  Medical history.  Physical exam.  Blood tests.  Imaging tests. How is this treated? Treatment for this condition may include:  Medicines to treat infections or allergies.  Home care, such as: ? Rest. ? Placing cold or warm cloths (compresses) on the skin.  Hospital care, if the condition is very bad. Follow these instructions at home: Medicines  Take over-the-counter and prescription medicines only as told by your doctor.  If you were prescribed an antibiotic medicine, take it as told by your doctor. Do not stop taking it even if you start to feel better. General instructions   Drink enough fluid to keep your pee (urine) pale yellow.  Do not touch or rub the infected area.  Raise (elevate) the infected area above the level of your heart while you are sitting or lying down.  Place cold or warm cloths on the area as told by your doctor.  Keep all follow-up visits as  told by your doctor. This is important. Contact a doctor if:  You have a fever.  You do not start to get better after 1-2 days of treatment.  Your bone or joint under the infected area starts to hurt after the skin has healed.  Your infection comes back. This can happen in the same area or another area.  You have a swollen bump in the area.  You have new symptoms.  You feel ill and have muscle aches and pains. Get help right away if:  Your symptoms get worse.  You feel very sleepy.  You throw up (vomit) or have watery poop (diarrhea) for a long time.  You see red streaks coming from the area.  Your red area gets larger.  Your red area turns dark in color. These symptoms may represent a serious problem that is an emergency. Do not wait to see if the symptoms will go away. Get medical help right away. Call your local emergency services (911 in the [place] Do not drive yourself to the hospital. Summary  Cellulitis is a skin infection. The area is often warm, red, swollen, and sore.  This condition is treated with medicines, rest, and cold and warm cloths.  Take all medicines only as told by your doctor.  Tell your doctor if symptoms do not start to get better after 1-2 days of treatment. This information is not intended to replace advice given to you by your health care provider. Make sure you discuss any questions you have with your health care provider. Document Revised: 12/13/2017 Document Reviewed: 12/13/2017 Elsevier Patient Education  2020 Elsevier Inc.      Agustina Caroli, MD [facility]

## 2020-02-19 NOTE — Patient Instructions (Addendum)
If you have lab work done today you will be contacted with your lab results within the next 2 weeks.  If you have not heard from [place] then please contact us. The fastest way to get your results is to register for My Chart.   IF you received an x-ray today, you will receive an invoice from [facility]. Please contact [facility] at [phone] with questions or concerns regarding your invoice.   IF you received labwork today, you will receive an invoice from [place]. Please contact [facility] at [phone] with questions or concerns regarding your invoice.   Our billing staff will not be able to assist you with questions regarding bills from these companies.  You will be contacted with the lab results as soon as they are available. The fastest way to get your results is to activate your My Chart account. Instructions are located on the last page of this paperwork. If you have not heard from [place] regarding the results in 2 weeks, please contact this office.    Cellulitis Cellulitis, Adult  Cellulitis is a skin infection. The infected area is often warm, red, swollen, and sore. It occurs most often in the arms and lower legs. It is very important to get treated for this condition. What are the causes? This condition is caused by bacteria. The bacteria enter through a break in the skin, such as a cut, burn, insect bite, open sore, or crack. What increases the risk? This condition is more likely to occur in people who:  Have a weak body defense system (immune system).  Have open cuts, burns, bites, or scrapes on the skin.  Are older than 73 years of age.  Have a blood sugar problem (diabetes).  Have a long-lasting (chronic) liver disease (cirrhosis) or kidney disease.  Are very overweight (obese).  Have a skin problem, such as: ? Itchy rash (eczema). ? Slow movement of blood in the veins (venous stasis). ? Fluid buildup below the skin (edema).  Have been  treated with high-energy rays (radiation).  Use IV drugs. What are the signs or symptoms? Symptoms of this condition include:  Skin that is: ? Red. ? Streaking. ? Spotting. ? Swollen. ? Sore or painful when you touch it. ? Warm.  A fever.  Chills.  Blisters. How is this diagnosed? This condition is diagnosed based on:  Medical history.  Physical exam.  Blood tests.  Imaging tests. How is this treated? Treatment for this condition may include:  Medicines to treat infections or allergies.  Home care, such as: ? Rest. ? Placing cold or warm cloths (compresses) on the skin.  Hospital care, if the condition is very bad. Follow these instructions at home: Medicines  Take over-the-counter and prescription medicines only as told by your doctor.  If you were prescribed an antibiotic medicine, take it as told by your doctor. Do not stop taking it even if you start to feel better. General instructions   Drink enough fluid to keep your pee (urine) pale yellow.  Do not touch or rub the infected area.  Raise (elevate) the infected area above the level of your heart while you are sitting or lying down.  Place cold or warm cloths on the area as told by your doctor.  Keep all follow-up visits as told by your doctor. This is important. Contact a doctor if:  You have a fever.  You do not start to get better after 1-2 days of treatment.  Your bone  or joint under the infected area starts to hurt after the skin has healed.  Your infection comes back. This can happen in the same area or another area.  You have a swollen bump in the area.  You have new symptoms.  You feel ill and have muscle aches and pains. Get help right away if:  Your symptoms get worse.  You feel very sleepy.  You throw up (vomit) or have watery poop (diarrhea) for a long time.  You see red streaks coming from the area.  Your red area gets larger.  Your red area turns dark in  color. These symptoms may represent a serious problem that is an emergency. Do not wait to see if the symptoms will go away. Get medical help right away. Call your local emergency services (911 in the [place] Do not drive yourself to the hospital. Summary  Cellulitis is a skin infection. The area is often warm, red, swollen, and sore.  This condition is treated with medicines, rest, and cold and warm cloths.  Take all medicines only as told by your doctor.  Tell your doctor if symptoms do not start to get better after 1-2 days of treatment. This information is not intended to replace advice given to you by your health care provider. Make sure you discuss any questions you have with your health care provider. Document Revised: 12/13/2017 Document Reviewed: 12/13/2017 Elsevier Patient Education  [place].

## 2020-02-26 ENCOUNTER — Telehealth: Payer: Self-pay | Admitting: *Deleted

## 2020-03-03 ENCOUNTER — Ambulatory Visit (INDEPENDENT_AMBULATORY_CARE_PROVIDER_SITE_OTHER): Payer: Medicare Other | Admitting: Family Medicine

## 2020-03-03 VITALS — BP 134/70 | Ht 62.2 in | Wt 158.0 lb

## 2020-03-03 DIAGNOSIS — Z0000 Encounter for general adult medical examination without abnormal findings: Secondary | ICD-10-CM

## 2020-03-03 NOTE — Progress Notes (Signed)
Presents today for [employer] Visit   Date of last exam: 05/13/2018  Interpreter used for this visit? No I connected with  Lee [name] on 03/03/20 by telephone and verified that I am speaking with the correct person using two identifiers.   I discussed the limitations of evaluation and management by telemedicine. The patient expressed understanding and agreed to proceed.  Patient location: home  Provider location: in office  I provided  20 minutes of non face - to - face time during this encounter.  Patient Care Team: Horald Pollen, MD as PCP - General (Internal Medicine)   Other items to address today:   Discussed Eye/Dental Discussed immunizations    Other Screening: Last screening for diabetes: 05/13/2018 Last lipid screening: 05/13/2018    Immunization status:  Immunization History  Administered Date(s) Administered   Influenza Split 04/08/2015   Influenza, High Dose Seasonal PF 04/12/2018   Influenza-Unspecified 04/07/2017   PFIZER SARS-COV-2 Vaccination 10/03/2019, 10/28/2019   Pneumococcal Conjugate-13 08/10/2016   Pneumococcal Polysaccharide-23 04/22/2018     Health Maintenance Due  Topic Date Due   Hepatitis C Screening  Never done     Functional Status Survey: Is the patient deaf or have difficulty hearing?: No Does the patient have difficulty seeing, even when wearing glasses/contacts?: No Does the patient have difficulty concentrating, remembering, or making decisions?: No Does the patient have difficulty walking or climbing stairs?: No Does the patient have difficulty dressing or bathing?: No Does the patient have difficulty doing errands alone such as visiting a doctor's office or shopping?: No   6CIT Screen 03/03/2020 05/13/2018  What Year? 0 points 0 points  What month? 0 points 0 points  What time? 0 points 0 points  Count back from 20 0 points 0 points  Months in reverse 0 points 0 points  Repeat  phrase 0 points 0 points  Total Score 0 0        Clinical Support from 03/03/2020 in [facility] [place]  AUDIT-C Score 0       Home Environment:   No trouble climbing stairs No grab bars No scattered rugs Lives in one story home Adequate lighting/ no clutter   There are no problems to display for this patient.    Past Medical History:  Diagnosis Date   Kidney stones      History reviewed. No pertinent surgical history.   Family History  Problem Relation Age of Onset   Stomach cancer Father    Colon cancer Neg Hx      Social History   Socioeconomic History   Marital status: Unknown    Spouse name: Not on file   Number of children: 2   Years of education: Not on file   Highest education level: Not on file  Occupational History   Occupation: resturant  Tobacco Use   Smoking status: Never Smoker   Smokeless tobacco: Never Used  Substance and Sexual Activity   Alcohol use: No    Alcohol/week: 0.0 standard drinks   Drug use: No   Sexual activity: Not on file  Other Topics Concern   Not on file  Social History Narrative   Not on file   Social Determinants of Health   Financial Resource Strain:    Difficulty of Paying Living Expenses:   Food Insecurity:    Worried About [place] in the Last Year:    [place] in the Last Year:  Transportation Needs:    Film/video editor (Medical):    Lack of Transportation (Non-Medical):   Physical Activity:    Days of Exercise per Week:    Minutes of Exercise per Session:   Stress:    Feeling of Stress :   Social Connections:    Frequency of Communication with Friends and Family:    Frequency of Social Gatherings with Friends and Family:    Attends Religious Services:    Active Member of Clubs or Organizations:    Attends Music therapist:    Marital Status:   Intimate Partner Violence:    Fear of Current or Ex-Partner:    Emotionally  Abused:    Physically Abused:    Sexually Abused:      No Known Allergies   Prior to Admission medications   Medication Sig Start Date End Date Taking? Authorizing Provider  metroNIDAZOLE (METROGEL) 0.75 % gel Apply 1 application topically 2 (two) times daily. 05/13/18  Yes Wendie Agreste, MD  pyridOXINE (VITAMIN B-6) 100 MG tablet Take 100 mg by mouth daily. Reported on 11/02/2015   Yes [provider]     Depression screen [facility] 2/9 03/03/2020 02/19/2020 05/13/2018 05/13/2018 04/22/2018  Decreased Interest 0 0 0 0 0  Down, Depressed, Hopeless 0 0 0 0 0  PHQ - 2 Score 0 0 0 0 0     Fall Risk  03/03/2020 02/19/2020 05/13/2018 05/13/2018 04/22/2018  Falls in the past year? 0 0 No No Yes  Comment - - - - -  Number falls in past yr: 0 0 - - -  Comment - - - - -  Injury with Fall? 0 0 - - -  Follow up Falls evaluation completed;Education provided - - - -      PHYSICAL EXAM: BP (!) 134/70 Comment: not in clinic/ taken from a previous visit   Ht 5' 2.2" (1.58 m)    Wt 158 lb (71.7 kg)    BMI 28.71 kg/m    Wt Readings from Last 3 Encounters:  03/03/20 158 lb (71.7 kg)  02/19/20 158 lb 12.8 oz (72 kg)  05/13/18 150 lb 9.6 oz (68.3 kg)       Education/Counseling provided regarding diet and exercise, prevention of chronic diseases, smoking/tobacco cessation, if applicable, and reviewed "Covered Medicare Preventive Services."

## 2020-03-03 NOTE — Patient Instructions (Signed)
Thank you for taking time to come for your Medicare Wellness Visit. I appreciate your ongoing commitment to your health goals. Please review the following plan we discussed and let me know if I can assist you in the future.  Lee Kennedy LPN  Preventive Care 73 Years and Older, Male Preventive care refers to lifestyle choices and visits with your health care provider that can promote health and wellness. This includes:  A yearly physical exam. This is also called an annual well check.  Regular dental and eye exams.  Immunizations.  Screening for certain conditions.  Healthy lifestyle choices, such as diet and exercise. What can I expect for my preventive care visit? Physical exam Your health care provider will check:  Height and weight. These may be used to calculate body mass index (BMI), which is a measurement that tells if you are at a healthy weight.  Heart rate and blood pressure.  Your skin for abnormal spots. Counseling Your health care provider may ask you questions about:  Alcohol, tobacco, and drug use.  Emotional well-being.  Home and relationship well-being.  Sexual activity.  Eating habits.  History of falls.  Memory and ability to understand (cognition).  Work and work Statistician. What immunizations do I need?  Influenza (flu) vaccine  This is recommended every year. Tetanus, diphtheria, and pertussis (Tdap) vaccine  You may need a Td booster every 10 years. Varicella (chickenpox) vaccine  You may need this vaccine if you have not already been vaccinated. Zoster (shingles) vaccine  You may need this after age 66. Pneumococcal conjugate (PCV13) vaccine  One dose is recommended after age 84. Pneumococcal polysaccharide (PPSV23) vaccine  One dose is recommended after age 88. Measles, mumps, and rubella (MMR) vaccine  You may need at least one dose of MMR if you were born in 1957 or later. You may also need a second dose. Meningococcal  conjugate (MenACWY) vaccine  You may need this if you have certain conditions. Hepatitis A vaccine  You may need this if you have certain conditions or if you travel or work in places where you may be exposed to hepatitis A. Hepatitis B vaccine  You may need this if you have certain conditions or if you travel or work in places where you may be exposed to hepatitis B. Haemophilus influenzae type b (Hib) vaccine  You may need this if you have certain conditions. You may receive vaccines as individual doses or as more than one vaccine together in one shot (combination vaccines). Talk with your health care provider about the risks and benefits of combination vaccines. What tests do I need? Blood tests  Lipid and cholesterol levels. These may be checked every 5 years, or more frequently depending on your overall health.  Hepatitis C test.  Hepatitis B test. Screening  Lung cancer screening. You may have this screening every year starting at age 24 if you have a 30-pack-year history of smoking and currently smoke or have quit within the past 15 years.  Colorectal cancer screening. All adults should have this screening starting at age 52 and continuing until age 61. Your health care provider may recommend screening at age 57 if you are at increased risk. You will have tests every 1-10 years, depending on your results and the type of screening test.  Prostate cancer screening. Recommendations will vary depending on your family history and other risks.  Diabetes screening. This is done by checking your blood sugar (glucose) after you have not eaten for  a while (fasting). You may have this done every 1-3 years.  Abdominal aortic aneurysm (AAA) screening. You may need this if you are a current or former smoker.  Sexually transmitted disease (STD) testing. Follow these instructions at home: Eating and drinking  Eat a diet that includes fresh fruits and vegetables, whole grains, lean  protein, and low-fat dairy products. Limit your intake of foods with high amounts of sugar, saturated fats, and salt.  Take vitamin and mineral supplements as recommended by your health care provider.  Do not drink alcohol if your health care provider tells you not to drink.  If you drink alcohol: ? Limit how much you have to 0-2 drinks a day. ? Be aware of how much alcohol is in your drink. In the [place] one drink equals one 12 oz bottle of beer (355 mL), one 5 oz glass of wine (148 mL), or one 1 oz glass of hard liquor (44 mL). Lifestyle  Take daily care of your teeth and gums.  Stay active. Exercise for at least 30 minutes on 5 or more days each week.  Do not use any products that contain nicotine or tobacco, such as cigarettes, e-cigarettes, and chewing tobacco. If you need help quitting, ask your health care provider.  If you are sexually active, practice safe sex. Use a condom or other form of protection to prevent STIs (sexually transmitted infections).  Talk with your health care provider about taking a low-dose aspirin or statin. What's next?  Visit your health care provider once a year for a well check visit.  Ask your health care provider how often you should have your eyes and teeth checked.  Stay up to date on all vaccines. This information is not intended to replace advice given to you by your health care provider. Make sure you discuss any questions you have with your health care provider. Document Revised: 07/18/2018 Document Reviewed: 07/18/2018 Elsevier Patient Education  2020 [employer]  

## 2020-03-03 NOTE — Telephone Encounter (Signed)
Schedule AWV.  

## 2020-03-08 ENCOUNTER — Encounter: Payer: Self-pay | Admitting: Internal Medicine

## 2020-03-10 DIAGNOSIS — H25043 Posterior subcapsular polar age-related cataract, bilateral: Secondary | ICD-10-CM | POA: Diagnosis not present

## 2020-03-10 DIAGNOSIS — H18413 Arcus senilis, bilateral: Secondary | ICD-10-CM | POA: Diagnosis not present

## 2020-03-10 DIAGNOSIS — H2511 Age-related nuclear cataract, right eye: Secondary | ICD-10-CM | POA: Diagnosis not present

## 2020-03-10 DIAGNOSIS — H2513 Age-related nuclear cataract, bilateral: Secondary | ICD-10-CM | POA: Diagnosis not present

## 2020-03-10 DIAGNOSIS — H25013 Cortical age-related cataract, bilateral: Secondary | ICD-10-CM | POA: Diagnosis not present

## 2020-04-13 DIAGNOSIS — H25012 Cortical age-related cataract, left eye: Secondary | ICD-10-CM | POA: Diagnosis not present

## 2020-04-13 DIAGNOSIS — H2511 Age-related nuclear cataract, right eye: Secondary | ICD-10-CM | POA: Diagnosis not present

## 2020-04-13 DIAGNOSIS — H2513 Age-related nuclear cataract, bilateral: Secondary | ICD-10-CM | POA: Diagnosis not present

## 2020-04-13 DIAGNOSIS — H2512 Age-related nuclear cataract, left eye: Secondary | ICD-10-CM | POA: Diagnosis not present

## 2020-04-13 DIAGNOSIS — H25042 Posterior subcapsular polar age-related cataract, left eye: Secondary | ICD-10-CM | POA: Diagnosis not present

## 2020-04-19 ENCOUNTER — Other Ambulatory Visit: Payer: Self-pay

## 2020-04-19 ENCOUNTER — Ambulatory Visit (AMBULATORY_SURGERY_CENTER): Payer: Self-pay | Admitting: *Deleted

## 2020-04-19 VITALS — Ht 62.0 in | Wt 156.0 lb

## 2020-04-19 DIAGNOSIS — Z86010 Personal history of colonic polyps: Secondary | ICD-10-CM

## 2020-04-19 MED ORDER — SUTAB 1479-225-188 MG PO TABS: 1.0000 | ORAL_TABLET | Freq: Once | ORAL | 0 refills | Status: AC

## 2020-04-19 NOTE — Progress Notes (Signed)
Pt had difficulty understanding instructions d/t language barrier.  Patient had previous colonoscopy and seemed to understand instructions with repetition.   No egg or soy allergy known to patient  No issues with past sedation with any surgeries or procedures no intubation problems in the past  No FH of Malignant Hyperthermia No diet pills per patient No home 02 use per patient  No blood thinners per patient  Pt denies issues with constipation  No A fib or A flutter  EMMI video to pt or via [place] 19 guidelines implemented in PV today with Pt and RN   sutab Coupon given to pt in PV today , Code to Pharmacy   Due to the COVID-19 pandemic we are asking patients to follow these guidelines. Please only bring one care partner. Please be aware that your care partner may wait in the car in the parking lot or if they feel like they will be too hot to wait in the car, they may wait in the lobby on the 4th floor. All care partners are required to wear a mask the entire time (we do not have any that we can provide them), they need to practice social distancing, and we will do a Covid check for all patient's and care partners when you arrive. Also we will check their temperature and your temperature. If the care partner waits in their car they need to stay in the parking lot the entire time and we will call them on their cell phone when the patient is ready for discharge so they can bring the car to the front of the building. Also all patient's will need to wear a mask into building.

## 2020-04-20 DIAGNOSIS — H2513 Age-related nuclear cataract, bilateral: Secondary | ICD-10-CM | POA: Diagnosis not present

## 2020-04-20 DIAGNOSIS — H2512 Age-related nuclear cataract, left eye: Secondary | ICD-10-CM | POA: Diagnosis not present

## 2020-04-22 DIAGNOSIS — Z23 Encounter for immunization: Secondary | ICD-10-CM | POA: Diagnosis not present

## 2020-05-07 ENCOUNTER — Ambulatory Visit (AMBULATORY_SURGERY_CENTER): Payer: Medicare Other | Admitting: Internal Medicine

## 2020-05-07 ENCOUNTER — Encounter: Payer: Self-pay | Admitting: Internal Medicine

## 2020-05-07 ENCOUNTER — Other Ambulatory Visit: Payer: Self-pay

## 2020-05-07 VITALS — BP 130/67 | HR 66 | Temp 97.5°F | Resp 19 | Ht 62.0 in | Wt 156.0 lb

## 2020-05-07 DIAGNOSIS — D122 Benign neoplasm of ascending colon: Secondary | ICD-10-CM | POA: Diagnosis not present

## 2020-05-07 DIAGNOSIS — Z86010 Personal history of colonic polyps: Secondary | ICD-10-CM

## 2020-05-07 DIAGNOSIS — D128 Benign neoplasm of rectum: Secondary | ICD-10-CM | POA: Diagnosis not present

## 2020-05-07 DIAGNOSIS — E669 Obesity, unspecified: Secondary | ICD-10-CM | POA: Diagnosis not present

## 2020-05-07 DIAGNOSIS — D124 Benign neoplasm of descending colon: Secondary | ICD-10-CM

## 2020-05-07 DIAGNOSIS — D120 Benign neoplasm of cecum: Secondary | ICD-10-CM

## 2020-05-07 MED ORDER — SODIUM CHLORIDE 0.9 % IV SOLN: 500.0000 mL | Freq: Once | INTRAVENOUS | Status: DC

## 2020-05-07 NOTE — Patient Instructions (Signed)
Read all of the handouts given to you by your recovery room nurse.  Thank-you for choosing us for your healthcare needs today.  YOU HAD AN ENDOSCOPIC PROCEDURE TODAY AT THE [facility]:   Refer to the procedure report that was given to you for any specific questions about what was found during the examination.  If the procedure report does not answer your questions, please call your gastroenterologist to clarify.  If you requested that your care partner not be given the details of your procedure findings, then the procedure report has been included in a sealed envelope for you to review at your convenience later.  YOU SHOULD EXPECT: Some feelings of bloating in the abdomen. Passage of more gas than usual.  Walking can help get rid of the air that was put into your GI tract during the procedure and reduce the bloating. If you had a lower endoscopy (such as a colonoscopy or flexible sigmoidoscopy) you may notice spotting of blood in your stool or on the toilet paper. If you underwent a bowel prep for your procedure, you may not have a normal bowel movement for a few days.  Please Note:  You might notice some irritation and congestion in your nose or some drainage.  This is from the oxygen used during your procedure.  There is no need for concern and it should clear up in a day or so.  SYMPTOMS TO REPORT IMMEDIATELY:   Following lower endoscopy (colonoscopy or flexible sigmoidoscopy):  Excessive amounts of blood in the stool  Significant tenderness or worsening of abdominal pains  Swelling of the abdomen that is new, acute  Fever of 100F or higher   For urgent or emergent issues, a gastroenterologist can be reached at any hour by calling [phone]. Do not use MyChart messaging for urgent concerns.    DIET:  We do recommend a small meal at first, but then you may proceed to your regular diet.  Drink plenty of fluids but you should avoid alcoholic beverages for 24 hours. Try to  increase the fiber in your diet, and drink plenty of water.  ACTIVITY:  You should plan to take it easy for the rest of today and you should NOT DRIVE or use heavy machinery until tomorrow (because of the sedation medicines used during the test).    FOLLOW UP: Our staff will call the number listed on your records 48-72 hours following your procedure to check on you and address any questions or concerns that you may have regarding the information given to you following your procedure. If we do not reach you, we will leave a message.  We will attempt to reach you two times.  During this call, we will ask if you have developed any symptoms of COVID 19. If you develop any symptoms (ie: fever, flu-like symptoms, shortness of breath, cough etc.) before then, please call [phone].  If you test positive for Covid 19 in the 2 weeks post procedure, please call and report this information to us.    If any biopsies were taken you will be contacted by phone or by letter within the next 1-3 weeks.  Please call us at [phone] if you have not heard about the biopsies in 3 weeks.    SIGNATURES/CONFIDENTIALITY: You and/or your care partner have signed paperwork which will be entered into your electronic medical record.  These signatures attest to the fact that that the information above on your After Visit Summary has been reviewed   is understood.  Full responsibility of the confidentiality of this discharge information lies with you and/or your care-partner.

## 2020-05-07 NOTE — Progress Notes (Signed)
Dr Henrene Pastor aware of Robinul (pre op HR in 50s)

## 2020-05-07 NOTE — Op Note (Signed)
[facility] Patient Name: Lee [name] Procedure Date: 05/07/2020 11:07 AM MRN: [id number] Endoscopist: Docia Chuck. Henrene Pastor , MD Age: 74 Referring MD:  Date of Birth: Apr 08, 1946 Gender: Male Account #: [id number] Procedure:                Colonoscopy with cold snare polypectomy x 4 Indications:              High risk colon cancer surveillance: Personal                            history of non-advanced adenoma. Previous exam 2016 Medicines:                Monitored Anesthesia Care Procedure:                Pre-Anesthesia Assessment:                           - Prior to the procedure, a History and Physical                            was performed, and patient medications and                            allergies were reviewed. The patient's tolerance of                            previous anesthesia was also reviewed. The risks                            and benefits of the procedure and the sedation                            options and risks were discussed with the patient.                            All questions were answered, and informed consent                            was obtained. Prior Anticoagulants: The patient has                            taken no previous anticoagulant or antiplatelet                            agents. ASA Grade Assessment: I - A normal, healthy                            patient. After reviewing the risks and benefits,                            the patient was deemed in satisfactory condition to                            undergo the procedure.  After obtaining informed consent, the colonoscope                            was passed under direct vision. Throughout the                            procedure, the patient's blood pressure, pulse, and                            oxygen saturations were monitored continuously. The                            Colonoscope was introduced through the anus and                             advanced to the the cecum, identified by                            appendiceal orifice and ileocecal valve. The                            ileocecal valve, appendiceal orifice, and rectum                            were photographed. The quality of the bowel                            preparation was good. The colonoscopy was performed                            without difficulty. The patient tolerated the                            procedure well. The bowel preparation used was                            SUPREP/tablets via split dose instruction. Scope In: 11:21:29 AM Scope Out: 11:34:51 AM Scope Withdrawal Time: 0 hours 11 minutes 43 seconds  Total Procedure Duration: 0 hours 13 minutes 22 seconds  Findings:                 Four polyps were found in the rectum, descending                            colon, ascending colon and cecum. The polyps were 3                            to 5 mm in size. These polyps were removed with a                            cold snare. Resection and retrieval were complete.                           Moderate internal hemorrhoids present.  The exam was                            otherwise without abnormality on direct and                            retroflexion views. Complications:            No immediate complications. Estimated blood loss:                            None. Estimated Blood Loss:     Estimated blood loss: none. Impression:               - Four 3 to 5 mm polyps in the rectum, in the                            descending colon, in the ascending colon and in the                            cecum, removed with a cold snare. Resected and                            retrieved.                           - Internal hemorrhoids                           - The examination was otherwise normal on direct                            and retroflexion views. Recommendation:           - Repeat colonoscopy in 3 years for surveillance,                             if 3 or more adenomas.                           - Patient has a contact number available for                            emergencies. The signs and symptoms of potential                            delayed complications were discussed with the                            patient. Return to normal activities tomorrow.                            Written discharge instructions were provided to the                            patient.                           -  Resume previous diet.                           - Continue present medications.                           - Await pathology results. Docia Chuck. Henrene Pastor, MD 05/07/2020 11:41:43 AM This report has been signed electronically.

## 2020-05-07 NOTE — Progress Notes (Signed)
Pt. Reports no change in his medical or surgical history since his pre-visit 04/19/2020.

## 2020-05-07 NOTE — Progress Notes (Signed)
Report to PACU, RN, vss, BBS= Clear.  

## 2020-05-11 ENCOUNTER — Telehealth: Payer: Self-pay

## 2020-05-11 ENCOUNTER — Telehealth: Payer: Self-pay | Admitting: *Deleted

## 2020-05-11 NOTE — Telephone Encounter (Signed)
First attempt, left VM.  

## 2020-05-11 NOTE — Telephone Encounter (Signed)
Second post procedure follow up call, no answer 

## 2020-05-12 ENCOUNTER — Encounter: Payer: Self-pay | Admitting: Internal Medicine

## 2020-05-14 DIAGNOSIS — Z23 Encounter for immunization: Secondary | ICD-10-CM | POA: Diagnosis not present

## 2020-05-25 ENCOUNTER — Telehealth: Payer: Self-pay | Admitting: Internal Medicine

## 2020-05-25 NOTE — Telephone Encounter (Signed)
Pt is requesting a call back from a nurse to discuss his complications when having a bowel movement, pt had a colonoscopy on 05/07/2020

## 2020-05-25 NOTE — Telephone Encounter (Signed)
Pt states he has has difficulty having a BM following the colonoscopy. Discussed with pt that he can try miralax 1-3 doses per day as needed. Pt states he will get the miralax and try this as instructed.

## 2020-11-04 DIAGNOSIS — Z23 Encounter for immunization: Secondary | ICD-10-CM | POA: Diagnosis not present

## 2020-11-16 ENCOUNTER — Ambulatory Visit (INDEPENDENT_AMBULATORY_CARE_PROVIDER_SITE_OTHER): Payer: Medicare Other | Admitting: Emergency Medicine

## 2020-11-16 ENCOUNTER — Other Ambulatory Visit: Payer: Self-pay

## 2020-11-16 ENCOUNTER — Encounter: Payer: Self-pay | Admitting: Emergency Medicine

## 2020-11-16 VITALS — BP 138/74 | HR 72 | Temp 98.2°F | Ht 62.0 in | Wt 148.0 lb

## 2020-11-16 DIAGNOSIS — R21 Rash and other nonspecific skin eruption: Secondary | ICD-10-CM | POA: Diagnosis not present

## 2020-11-16 DIAGNOSIS — L219 Seborrheic dermatitis, unspecified: Secondary | ICD-10-CM | POA: Diagnosis not present

## 2020-11-16 NOTE — Progress Notes (Signed)
Lee [name] 75 y.o.   Chief Complaint  Patient presents with  . Rash    On the face    HISTORY OF PRESENT ILLNESS: This is a 75 y.o. male with history of chronic rash to his face for many years presently on MetroGel ointment, here for follow-up and possible dermatology referral. No other complaints or medical concerns today.  HPI   Prior to Admission medications   Medication Sig Start Date End Date Taking? Authorizing Provider  metroNIDAZOLE (METROGEL) 0.75 % gel Apply 1 application topically 2 (two) times daily. 05/13/18  Yes Lee Agreste, MD  pyridOXINE (VITAMIN B-6) 100 MG tablet Take 100 mg by mouth daily. Reported on 11/02/2015   Yes [provider]  BESIVANCE 0.6 % SUSP Apply 1 drop to eye 3 (three) times daily. Patient not taking: Reported on 11/16/2020 04/15/20   [provider]  DUREZOL 0.05 % EMUL Apply to eye. Patient not taking: Reported on 11/16/2020 04/15/20   [provider]  PROLENSA 0.07 % SOLN Apply 1 drop to eye 2 (two) times daily. Patient not taking: Reported on 11/16/2020 04/15/20   [provider]    No Known Allergies  There are no problems to display for this patient.   Past Medical History:  Diagnosis Date  . Cataract   . Kidney stones     Past Surgical History:  Procedure Laterality Date  . CATARACT EXTRACTION, BILATERAL    . COLONOSCOPY  2016  . POLYPECTOMY  2016   tubular adenoma    Social History   Socioeconomic History  . Marital status: Unknown    Spouse name: Not on file  . Number of children: 2  . Years of education: Not on file  . Highest education level: Not on file  Occupational History  . Occupation: resturant  Tobacco Use  . Smoking status: Never Smoker  . Smokeless tobacco: Never Used  Substance and Sexual Activity  . Alcohol use: No    Alcohol/week: 0.0 standard drinks  . Drug use: No  . Sexual activity: Not on file  Other Topics Concern  . Not on file  Social History  Narrative  . Not on file   Social Determinants of Health   Financial Resource Strain: Not on file  Food Insecurity: Not on file  Transportation Needs: Not on file  Physical Activity: Not on file  Stress: Not on file  Social Connections: Not on file  Intimate Partner Violence: Not on file    Family History  Problem Relation Age of Onset  . Stomach cancer Father   . Colon cancer Neg Hx   . Colon polyps Neg Hx   . Esophageal cancer Neg Hx      Review of Systems  Constitutional: Negative.  Negative for chills and fever.  HENT: Negative.  Negative for congestion and sore throat.   Respiratory: Negative.  Negative for cough and shortness of breath.   Cardiovascular: Negative.  Negative for chest pain and palpitations.  Gastrointestinal: Negative.  Negative for abdominal pain, diarrhea, nausea and vomiting.  Genitourinary: Negative.   Skin: Positive for rash. Negative for itching.  Neurological: Negative.  Negative for dizziness and headaches.  All other systems reviewed and are negative.  Today's Vitals   11/16/20 0945  BP: 138/74  Pulse: 72  Temp: 98.2 F (36.8 C)  TempSrc: Oral  SpO2: 97%  Weight: 148 lb (67.1 kg)  Height: 5\' 2"  (1.575 m)   Body mass index is 27.07 kg/m.  Physical Exam Vitals reviewed.  Constitutional:      Appearance: Normal appearance.  HENT:     Head: Normocephalic.     Mouth/Throat:     Mouth: Mucous membranes are moist.     Pharynx: Oropharynx is clear.  Eyes:     Extraocular Movements: Extraocular movements intact.     Pupils: Pupils are equal, round, and reactive to light.  Cardiovascular:     Rate and Rhythm: Normal rate.  Pulmonary:     Effort: Pulmonary effort is normal.  Musculoskeletal:     Cervical back: Normal range of motion and neck supple.  Skin:    General: Skin is warm and dry.     Capillary Refill: Capillary refill takes less than 2 seconds.     Findings: Rash (Erythematous scaly facial rash sparing nasolabial  folds) present.  Neurological:     General: No focal deficit present.     Mental Status: He is alert and oriented to person, place, and time.  Psychiatric:        Mood and Affect: Mood normal.        Behavior: Behavior normal.      ASSESSMENT & PLAN: Lea was seen today for rash.  Diagnoses and all orders for this visit:  Seborrheic dermatitis -     Ambulatory referral to Dermatology  Facial rash    Patient Instructions   Seborrheic Dermatitis, Adult Continue MetroGel cream.   Seborrheic dermatitis is a skin disease that causes red, scaly patches. It usually occurs on the scalp, and it is often called dandruff. The patches may appear on other parts of the body. Skin patches tend to appear where there are many oil glands in the skin. Areas of the body that are commonly affected include the:  Scalp.  Ears.  Eyebrows.  Face.  Bearded area of [employer].  Skin folds of the body, such as the armpits, groin, and buttocks.  Chest. The condition may come and go for no known reason, and it is often long-lasting (chronic). What are the causes? The cause of this condition is not known. What increases the risk? The following factors may make you more likely to develop this condition:  Having certain conditions, such as: ? HIV (human immunodeficiency virus). ? AIDS (acquired immunodeficiency syndrome). ? Parkinson's disease. ? Mood disorders, such as depression.  Being 58-59 years old. What are the signs or symptoms? Symptoms of this condition include:  Thick scales on the scalp.  Redness on the face or in the armpits.  Skin that is flaky. The flakes may be white or yellow.  Skin that seems oily or dry but is not helped with moisturizers.  Itching or burning in the affected areas.   How is this diagnosed? This condition is diagnosed with a medical history and physical exam. A sample of your skin may be tested (skin biopsy). You may need to see a skin  specialist (dermatologist). How is this treated? There is no cure for this condition, but treatment can help to manage the symptoms. You may get treatment to remove scales, lower the risk of skin infection, and reduce swelling or itching. Treatment may include:  Creams that reduce skin yeast.  Medicated shampoo.  Moisturizing creams or ointments.  Creams that reduce swelling and irritation (steroids). Follow these instructions at home:  Apply over-the-counter and prescription medicines only as told by your health care provider.  Use any medicated shampoo, skin creams, or ointments only as told by your health care  provider.  Keep all follow-up visits as told by your health care provider. This is important. Contact a health care provider if:  Your symptoms do not improve with treatment.  Your symptoms get worse.  You have new symptoms. Get help right away if:  Your condition rapidly worsens with treatment. Summary  Seborrheic dermatitis is a skin disease that causes red, scaly patches.  Seborrheic dermatitis commonly affects the scalp, face, and skin folds.  There is no cure for this condition, but treatment can help to manage the symptoms. This information is not intended to replace advice given to you by your health care provider. Make sure you discuss any questions you have with your health care provider. Document Revised: 05/01/2019 Document Reviewed: 05/01/2019 Elsevier Patient Education  2021 [place], MD [facility]

## 2020-11-16 NOTE — Patient Instructions (Addendum)
Seborrheic Dermatitis, Adult Continue MetroGel cream.   Seborrheic dermatitis is a skin disease that causes red, scaly patches. It usually occurs on the scalp, and it is often called dandruff. The patches may appear on other parts of the body. Skin patches tend to appear where there are many oil glands in the skin. Areas of the body that are commonly affected include the:  Scalp.  Ears.  Eyebrows.  Face.  Bearded area of [employer].  Skin folds of the body, such as the armpits, groin, and buttocks.  Chest. The condition may come and go for no known reason, and it is often long-lasting (chronic). What are the causes? The cause of this condition is not known. What increases the risk? The following factors may make you more likely to develop this condition:  Having certain conditions, such as: ? HIV (human immunodeficiency virus). ? AIDS (acquired immunodeficiency syndrome). ? Parkinson's disease. ? Mood disorders, such as depression.  Being 58-66 years old. What are the signs or symptoms? Symptoms of this condition include:  Thick scales on the scalp.  Redness on the face or in the armpits.  Skin that is flaky. The flakes may be white or yellow.  Skin that seems oily or dry but is not helped with moisturizers.  Itching or burning in the affected areas.   How is this diagnosed? This condition is diagnosed with a medical history and physical exam. A sample of your skin may be tested (skin biopsy). You may need to see a skin specialist (dermatologist). How is this treated? There is no cure for this condition, but treatment can help to manage the symptoms. You may get treatment to remove scales, lower the risk of skin infection, and reduce swelling or itching. Treatment may include:  Creams that reduce skin yeast.  Medicated shampoo.  Moisturizing creams or ointments.  Creams that reduce swelling and irritation (steroids). Follow these instructions at home:  Apply  over-the-counter and prescription medicines only as told by your health care provider.  Use any medicated shampoo, skin creams, or ointments only as told by your health care provider.  Keep all follow-up visits as told by your health care provider. This is important. Contact a health care provider if:  Your symptoms do not improve with treatment.  Your symptoms get worse.  You have new symptoms. Get help right away if:  Your condition rapidly worsens with treatment. Summary  Seborrheic dermatitis is a skin disease that causes red, scaly patches.  Seborrheic dermatitis commonly affects the scalp, face, and skin folds.  There is no cure for this condition, but treatment can help to manage the symptoms. This information is not intended to replace advice given to you by your health care provider. Make sure you discuss any questions you have with your health care provider. Document Revised: 05/01/2019 Document Reviewed: 05/01/2019 Elsevier Patient Education  [employer].

## 2021-03-03 DIAGNOSIS — L718 Other rosacea: Secondary | ICD-10-CM | POA: Diagnosis not present

## 2021-03-23 DIAGNOSIS — Z23 Encounter for immunization: Secondary | ICD-10-CM | POA: Diagnosis not present

## 2021-03-26 DIAGNOSIS — T63441A Toxic effect of venom of bees, accidental (unintentional), initial encounter: Secondary | ICD-10-CM | POA: Diagnosis not present

## 2021-03-28 ENCOUNTER — Telehealth: Payer: Self-pay | Admitting: Emergency Medicine

## 2021-12-21 ENCOUNTER — Ambulatory Visit (INDEPENDENT_AMBULATORY_CARE_PROVIDER_SITE_OTHER): Payer: Medicare HMO | Admitting: Emergency Medicine

## 2021-12-21 ENCOUNTER — Encounter: Payer: Self-pay | Admitting: Emergency Medicine

## 2021-12-21 VITALS — BP 110/60 | HR 58 | Temp 98.5°F | Ht 62.0 in | Wt 162.4 lb

## 2021-12-21 DIAGNOSIS — Z13220 Encounter for screening for lipoid disorders: Secondary | ICD-10-CM | POA: Diagnosis not present

## 2021-12-21 DIAGNOSIS — Z1159 Encounter for screening for other viral diseases: Secondary | ICD-10-CM | POA: Diagnosis not present

## 2021-12-21 DIAGNOSIS — Z0000 Encounter for general adult medical examination without abnormal findings: Secondary | ICD-10-CM | POA: Diagnosis not present

## 2021-12-21 DIAGNOSIS — Z13228 Encounter for screening for other metabolic disorders: Secondary | ICD-10-CM | POA: Diagnosis not present

## 2021-12-21 DIAGNOSIS — Z130 Encounter for screening for diseases of the blood and blood-forming organs and certain disorders involving the immune mechanism: Secondary | ICD-10-CM | POA: Diagnosis not present

## 2021-12-21 DIAGNOSIS — Z125 Encounter for screening for malignant neoplasm of prostate: Secondary | ICD-10-CM | POA: Diagnosis not present

## 2021-12-21 DIAGNOSIS — Z1329 Encounter for screening for other suspected endocrine disorder: Secondary | ICD-10-CM | POA: Diagnosis not present

## 2021-12-21 LAB — LIPID PANEL
Cholesterol: 203 mg/dL — ABNORMAL HIGH (ref 0–200)
HDL: 47.4 mg/dL (ref >39.00)
LDL Cholesterol: 131 mg/dL — ABNORMAL HIGH (ref 0–99)
NonHDL: 155.95
Total CHOL/HDL Ratio: 4
Triglycerides: 125 mg/dL (ref 0.0–149.0)
VLDL: 25 mg/dL (ref 0.0–40.0)

## 2021-12-21 LAB — COMPREHENSIVE METABOLIC PANEL
ALT: 20 U/L (ref 0–53)
AST: 24 U/L (ref 0–37)
Albumin: 4.4 g/dL (ref 3.5–5.2)
Alkaline Phosphatase: 51 U/L (ref 39–117)
BUN: 15 mg/dL (ref 6–23)
CO2: 28 mEq/L (ref 19–32)
Calcium: 9.4 mg/dL (ref 8.4–10.5)
Chloride: 103 mEq/L (ref 96–112)
Creatinine, Ser: 0.88 mg/dL (ref 0.40–1.50)
GFR: 84.51 mL/min (ref >60.00)
Glucose, Bld: 113 mg/dL — ABNORMAL HIGH (ref 70–99)
Potassium: 4.9 mEq/L (ref 3.5–5.1)
Sodium: 141 mEq/L (ref 135–145)
Total Bilirubin: 0.9 mg/dL (ref 0.2–1.2)
Total Protein: 6.9 g/dL (ref 6.0–8.3)

## 2021-12-21 LAB — CBC WITH DIFFERENTIAL/PLATELET
Basophils Absolute: 0 10*3/uL (ref 0.0–0.1)
Basophils Relative: 0.6 % (ref 0.0–3.0)
Eosinophils Absolute: 0.1 10*3/uL (ref 0.0–0.7)
Eosinophils Relative: 2.3 % (ref 0.0–5.0)
HCT: 38.6 % — ABNORMAL LOW (ref 39.0–52.0)
Hemoglobin: 13.1 g/dL (ref 13.0–17.0)
Lymphocytes Relative: 29.6 % (ref 12.0–46.0)
Lymphs Abs: 1.3 10*3/uL (ref 0.7–4.0)
MCHC: 33.9 g/dL (ref 30.0–36.0)
MCV: 95 fl (ref 78.0–100.0)
Monocytes Absolute: 0.4 10*3/uL (ref 0.1–1.0)
Monocytes Relative: 8.2 % (ref 3.0–12.0)
Neutro Abs: 2.6 10*3/uL (ref 1.4–7.7)
Neutrophils Relative %: 59.3 % (ref 43.0–77.0)
Platelets: 247 10*3/uL (ref 150.0–400.0)
RBC: 4.06 Mil/uL — ABNORMAL LOW (ref 4.22–5.81)
RDW: 13.2 % (ref 11.5–15.5)
WBC: 4.4 10*3/uL (ref 4.0–10.5)

## 2021-12-21 LAB — HEMOGLOBIN A1C: Hgb A1c MFr Bld: 5.9 % (ref 4.6–6.5)

## 2021-12-21 LAB — PSA: PSA: 2.58 ng/mL (ref 0.10–4.00)

## 2021-12-21 NOTE — Progress Notes (Signed)
Lee [name] ?76 y.o. ? ? ?Chief Complaint  ?Patient presents with  ? Annual Exam  ? Constipation  ? concer with over eating   ? ? ?HISTORY OF PRESENT ILLNESS: ?This is a 76 y.o. male here for annual exam. ?Has occasional constipation. ?Healthy male with a healthy lifestyle. ?No chronic medical problems.  No chronic medications. ?Non-smoker. ?No other complaints or medical concerns today. ? ?Constipation ?Pertinent negatives include no abdominal pain, diarrhea, fever, nausea or vomiting.  ? ? ?Prior to Admission medications   ?Medication Sig Start Date End Date Taking? Authorizing Provider  ?metroNIDAZOLE (METROGEL) 0.75 % gel Apply 1 application topically 2 (two) times daily. 05/13/18  Yes Wendie Agreste, MD  ?pyridOXINE (VITAMIN B-6) 100 MG tablet Take 100 mg by mouth daily. Reported on 11/02/2015   Yes [provider]  ?BESIVANCE 0.6 % SUSP Apply 1 drop to eye 3 (three) times daily. ?Patient not taking: Reported on 11/16/2020 04/15/20   [provider]  ?DUREZOL 0.05 % EMUL Apply to eye. ?Patient not taking: Reported on 11/16/2020 04/15/20   [provider]  ?PROLENSA 0.07 % SOLN Apply 1 drop to eye 2 (two) times daily. ?Patient not taking: Reported on 11/16/2020 04/15/20   [provider]  ? ? ?No Known Allergies ? ?There are no problems to display for this patient. ? ? ?Past Medical History:  ?Diagnosis Date  ? Cataract   ? Kidney stones   ? ? ?Past Surgical History:  ?Procedure Laterality Date  ? CATARACT EXTRACTION, BILATERAL    ? COLONOSCOPY  2016  ? POLYPECTOMY  2016  ? tubular adenoma  ? ? ?Social History  ? ?Socioeconomic History  ? Marital status: Unknown  ?  Spouse name: Not on file  ? Number of children: 2  ? Years of education: Not on file  ? Highest education level: Not on file  ?Occupational History  ? Occupation: resturant  ?Tobacco Use  ? Smoking status: Never  ? Smokeless tobacco: Never  ?Substance and Sexual Activity  ? Alcohol use: No  ?  Alcohol/week: 0.0  standard drinks  ? Drug use: No  ? Sexual activity: Not on file  ?Other Topics Concern  ? Not on file  ?Social History Narrative  ? Not on file  ? ?Social Determinants of Health  ? ?Financial Resource Strain: Not on file  ?Food Insecurity: Not on file  ?Transportation Needs: Not on file  ?Physical Activity: Not on file  ?Stress: Not on file  ?Social Connections: Not on file  ?Intimate Partner Violence: Not on file  ? ? ?Family History  ?Problem Relation Age of Onset  ? Stomach cancer Father   ? Colon cancer Neg Hx   ? Colon polyps Neg Hx   ? Esophageal cancer Neg Hx   ? ? ? ?Review of Systems  ?Constitutional: Negative.  Negative for chills and fever.  ?HENT: Negative.  Negative for congestion and sore throat.   ?Eyes: Negative.   ?Respiratory: Negative.  Negative for cough and shortness of breath.   ?Cardiovascular: Negative.  Negative for chest pain and palpitations.  ?Gastrointestinal:  Positive for constipation. Negative for abdominal pain, diarrhea, nausea and vomiting.  ?Genitourinary: Negative.  Negative for dysuria and hematuria.  ?Skin: Negative.  Negative for rash.  ?Neurological: Negative.  Negative for dizziness and headaches.  ?All other systems reviewed and are negative. ? ?Today's Vitals  ? 12/21/21 0817  ?BP: 110/60  ?Pulse: (!) 58  ?Temp: 98.5 ?F (36.9 ?C)  ?TempSrc:  Oral  ?SpO2: 91%  ?Weight: 162 lb 6 oz (73.7 kg)  ?Height: '5\' 2"'$  (1.575 m)  ? ?Body mass index is 29.7 kg/m?. ? ?Physical Exam ?Vitals reviewed.  ?Constitutional:   ?   Appearance: Normal appearance.  ?HENT:  ?   Head: Normocephalic.  ?   Right Ear: Tympanic membrane, ear canal and external ear normal.  ?   Left Ear: Tympanic membrane, ear canal and external ear normal.  ?   Mouth/Throat:  ?   Mouth: Mucous membranes are moist.  ?   Pharynx: Oropharynx is clear.  ?Eyes:  ?   Extraocular Movements: Extraocular movements intact.  ?   Conjunctiva/sclera: Conjunctivae normal.  ?   Pupils: Pupils are equal, round, and reactive to light.   ?Cardiovascular:  ?   Rate and Rhythm: Normal rate and regular rhythm.  ?   Pulses: Normal pulses.  ?   Heart sounds: Normal heart sounds.  ?Pulmonary:  ?   Effort: Pulmonary effort is normal.  ?   Breath sounds: Normal breath sounds.  ?Abdominal:  ?   General: Bowel sounds are normal. There is no distension.  ?   Palpations: Abdomen is soft.  ?   Tenderness: There is no abdominal tenderness.  ?Musculoskeletal:     ?   General: Normal range of motion.  ?   Cervical back: No tenderness.  ?Lymphadenopathy:  ?   Cervical: No cervical adenopathy.  ?Skin: ?   General: Skin is warm and dry.  ?   Capillary Refill: Capillary refill takes less than 2 seconds.  ?Neurological:  ?   General: No focal deficit present.  ?   Mental Status: He is alert and oriented to person, place, and time.  ?Psychiatric:     ?   Mood and Affect: Mood normal.     ?   Behavior: Behavior normal.  ? ? ? ?ASSESSMENT & PLAN: ?Problem List Items Addressed This Visit   ?None ?Visit Diagnoses   ? ? Routine general medical examination at a health care facility    -  Primary  ? Prostate cancer screening      ? Relevant Orders  ? PSA(Must document that pt has been informed of limitations of PSA testing.)  ? Need for hepatitis C screening test      ? Relevant Orders  ? Hepatitis C antibody screen  ? Screening for deficiency anemia      ? Relevant Orders  ? CBC with Differential  ? Screening for lipoid disorders      ? Relevant Orders  ? Lipid panel  ? Screening for endocrine, metabolic and immunity disorder      ? Relevant Orders  ? Comprehensive metabolic panel  ? Hemoglobin A1c  ? ?  ? ?Modifiable risk factors discussed with patient. ?Anticipatory guidance according to age provided. ?The following topics were also discussed: ?Social Determinants of Health ?Smoking.  Non-smoker ?Diet and nutrition ?Benefits of exercise ?Constipation on treatment ?Cancer screening and review of most recent colonoscopy report ?Vaccinations recommendations ?Cardiovascular  risk assessment and need for blood work ?Mental health including depression and anxiety ?Fall and accident prevention ? ?Patient Instructions  ?Health Maintenance, Male ?Adopting a healthy lifestyle and getting preventive care are important in promoting health and wellness. Ask your health care provider about: ?The right schedule for you to have regular tests and exams. ?Things you can do on your own to prevent diseases and keep yourself healthy. ?What should I know about diet, weight, and  exercise? ?Eat a healthy diet ? ?Eat a diet that includes plenty of vegetables, fruits, low-fat dairy products, and lean protein. ?Do not eat a lot of foods that are high in solid fats, added sugars, or sodium. ?Maintain a healthy weight ?Body mass index (BMI) is a measurement that can be used to identify possible weight problems. It estimates body fat based on height and weight. Your health care provider can help determine your BMI and help you achieve or maintain a healthy weight. ?Get regular exercise ?Get regular exercise. This is one of the most important things you can do for your health. Most adults should: ?Exercise for at least 150 minutes each week. The exercise should increase your heart rate and make you sweat (moderate-intensity exercise). ?Do strengthening exercises at least twice a week. This is in addition to the moderate-intensity exercise. ?Spend less time sitting. Even light physical activity can be beneficial. ?Watch cholesterol and blood lipids ?Have your blood tested for lipids and cholesterol at 75 years of age, then have this test every 5 years. ?You may need to have your cholesterol levels checked more often if: ?Your lipid or cholesterol levels are high. ?You are older than 75 years of age. ?You are at high risk for heart disease. ?What should I know about cancer screening? ?Many types of cancers can be detected early and may often be prevented. Depending on your health history and family history, you may  need to have cancer screening at various ages. This may include screening for: ?Colorectal cancer. ?Prostate cancer. ?Skin cancer. ?Lung cancer. ?What should I know about heart disease, diabetes, and high blood pr

## 2021-12-21 NOTE — Patient Instructions (Signed)
Health Maintenance, Male Adopting a healthy lifestyle and getting preventive care are important in promoting health and wellness. Ask your health care provider about: The right schedule for you to have regular tests and exams. Things you can do on your own to prevent diseases and keep yourself healthy. What should I know about diet, weight, and exercise? Eat a healthy diet  Eat a diet that includes plenty of vegetables, fruits, low-fat dairy products, and lean protein. Do not eat a lot of foods that are high in solid fats, added sugars, or sodium. Maintain a healthy weight Body mass index (BMI) is a measurement that can be used to identify possible weight problems. It estimates body fat based on height and weight. Your health care provider can help determine your BMI and help you achieve or maintain a healthy weight. Get regular exercise Get regular exercise. This is one of the most important things you can do for your health. Most adults should: Exercise for at least 150 minutes each week. The exercise should increase your heart rate and make you sweat (moderate-intensity exercise). Do strengthening exercises at least twice a week. This is in addition to the moderate-intensity exercise. Spend less time sitting. Even light physical activity can be beneficial. Watch cholesterol and blood lipids Have your blood tested for lipids and cholesterol at 75 years of age, then have this test every 5 years. You may need to have your cholesterol levels checked more often if: Your lipid or cholesterol levels are high. You are older than 75 years of age. You are at high risk for heart disease. What should I know about cancer screening? Many types of cancers can be detected early and may often be prevented. Depending on your health history and family history, you may need to have cancer screening at various ages. This may include screening for: Colorectal cancer. Prostate cancer. Skin cancer. Lung  cancer. What should I know about heart disease, diabetes, and high blood pressure? Blood pressure and heart disease High blood pressure causes heart disease and increases the risk of stroke. This is more likely to develop in people who have high blood pressure readings or are overweight. Talk with your health care provider about your target blood pressure readings. Have your blood pressure checked: Every 3-5 years if you are 18-39 years of age. Every year if you are 40 years old or older. If you are between the ages of 65 and 75 and are a current or former smoker, ask your health care provider if you should have a one-time screening for abdominal aortic aneurysm (AAA). Diabetes Have regular diabetes screenings. This checks your fasting blood sugar level. Have the screening done: Once every three years after age 45 if you are at a normal weight and have a low risk for diabetes. More often and at a younger age if you are overweight or have a high risk for diabetes. What should I know about preventing infection? Hepatitis B If you have a higher risk for hepatitis B, you should be screened for this virus. Talk with your health care provider to find out if you are at risk for hepatitis B infection. Hepatitis C Blood testing is recommended for: Everyone born from 1945 through 1965. Anyone with known risk factors for hepatitis C. Sexually transmitted infections (STIs) You should be screened each year for STIs, including gonorrhea and chlamydia, if: You are sexually active and are younger than 75 years of age. You are older than 75 years of age and your   health care provider tells you that you are at risk for this type of infection. Your sexual activity has changed since you were last screened, and you are at increased risk for chlamydia or gonorrhea. Ask your health care provider if you are at risk. Ask your health care provider about whether you are at high risk for HIV. Your health care provider  may recommend a prescription medicine to help prevent HIV infection. If you choose to take medicine to prevent HIV, you should first get tested for HIV. You should then be tested every 3 months for as long as you are taking the medicine. Follow these instructions at home: Alcohol use Do not drink alcohol if your health care provider tells you not to drink. If you drink alcohol: Limit how much you have to 0-2 drinks a day. Know how much alcohol is in your drink. In the [place] one drink equals one 12 oz bottle of beer (355 mL), one 5 oz glass of wine (148 mL), or one 1 oz glass of hard liquor (44 mL). Lifestyle Do not use any products that contain nicotine or tobacco. These products include cigarettes, chewing tobacco, and vaping devices, such as e-cigarettes. If you need help quitting, ask your health care provider. Do not use street drugs. Do not share needles. Ask your health care provider for help if you need support or information about quitting drugs. General instructions Schedule regular health, dental, and eye exams. Stay current with your vaccines. Tell your health care provider if: You often feel depressed. You have ever been abused or do not feel safe at home. Summary Adopting a healthy lifestyle and getting preventive care are important in promoting health and wellness. Follow your health care provider's instructions about healthy diet, exercising, and getting tested or screened for diseases. Follow your health care provider's instructions on monitoring your cholesterol and blood pressure. This information is not intended to replace advice given to you by your health care provider. Make sure you discuss any questions you have with your health care provider. Document Revised: 12/13/2020 Document Reviewed: 12/13/2020 Elsevier Patient Education  2023 [employer]  

## 2021-12-22 LAB — HEPATITIS C ANTIBODY
Hepatitis C Ab: NONREACTIVE
SIGNAL TO CUT-OFF: 0.19 (ref <1.00)

## 2023-03-28 ENCOUNTER — Ambulatory Visit (INDEPENDENT_AMBULATORY_CARE_PROVIDER_SITE_OTHER): Payer: Medicare PPO | Admitting: Emergency Medicine

## 2023-03-28 ENCOUNTER — Encounter: Payer: Self-pay | Admitting: Emergency Medicine

## 2023-03-28 VITALS — BP 120/78 | HR 58 | Temp 97.5°F | Ht 62.0 in | Wt 156.0 lb

## 2023-03-28 DIAGNOSIS — Z13228 Encounter for screening for other metabolic disorders: Secondary | ICD-10-CM

## 2023-03-28 DIAGNOSIS — Z130 Encounter for screening for diseases of the blood and blood-forming organs and certain disorders involving the immune mechanism: Secondary | ICD-10-CM | POA: Diagnosis not present

## 2023-03-28 DIAGNOSIS — Z125 Encounter for screening for malignant neoplasm of prostate: Secondary | ICD-10-CM | POA: Diagnosis not present

## 2023-03-28 DIAGNOSIS — Z0000 Encounter for general adult medical examination without abnormal findings: Secondary | ICD-10-CM | POA: Diagnosis not present

## 2023-03-28 DIAGNOSIS — Z860100 Personal history of colon polyps, unspecified: Secondary | ICD-10-CM | POA: Insufficient documentation

## 2023-03-28 DIAGNOSIS — Z0001 Encounter for general adult medical examination with abnormal findings: Secondary | ICD-10-CM

## 2023-03-28 DIAGNOSIS — Z1329 Encounter for screening for other suspected endocrine disorder: Secondary | ICD-10-CM

## 2023-03-28 DIAGNOSIS — Z1211 Encounter for screening for malignant neoplasm of colon: Secondary | ICD-10-CM

## 2023-03-28 DIAGNOSIS — Z13220 Encounter for screening for lipoid disorders: Secondary | ICD-10-CM | POA: Diagnosis not present

## 2023-03-28 DIAGNOSIS — R399 Unspecified symptoms and signs involving the genitourinary system: Secondary | ICD-10-CM | POA: Diagnosis not present

## 2023-03-28 LAB — CBC WITH DIFFERENTIAL/PLATELET
Basophils Absolute: 0 10*3/uL (ref 0.0–0.1)
Basophils Relative: 0.4 % (ref 0.0–3.0)
Eosinophils Absolute: 0.1 10*3/uL (ref 0.0–0.7)
Eosinophils Relative: 1.3 % (ref 0.0–5.0)
HCT: 41.4 % (ref 39.0–52.0)
Hemoglobin: 13.9 g/dL (ref 13.0–17.0)
Lymphocytes Relative: 28.3 % (ref 12.0–46.0)
Lymphs Abs: 1.5 10*3/uL (ref 0.7–4.0)
MCHC: 33.5 g/dL (ref 30.0–36.0)
MCV: 96.8 fl (ref 78.0–100.0)
Monocytes Absolute: 0.4 10*3/uL (ref 0.1–1.0)
Monocytes Relative: 8.3 % (ref 3.0–12.0)
Neutro Abs: 3.2 10*3/uL (ref 1.4–7.7)
Neutrophils Relative %: 61.7 % (ref 43.0–77.0)
Platelets: 261 10*3/uL (ref 150.0–400.0)
RBC: 4.28 Mil/uL (ref 4.22–5.81)
RDW: 13.5 % (ref 11.5–15.5)
WBC: 5.2 10*3/uL (ref 4.0–10.5)

## 2023-03-28 LAB — LDL CHOLESTEROL, DIRECT: Direct LDL: 167 mg/dL

## 2023-03-28 LAB — COMPREHENSIVE METABOLIC PANEL
ALT: 14 U/L (ref 0–53)
AST: 19 U/L (ref 0–37)
Albumin: 4.5 g/dL (ref 3.5–5.2)
Alkaline Phosphatase: 53 U/L (ref 39–117)
BUN: 15 mg/dL (ref 6–23)
CO2: 30 meq/L (ref 19–32)
Calcium: 9.7 mg/dL (ref 8.4–10.5)
Chloride: 103 meq/L (ref 96–112)
Creatinine, Ser: 0.79 mg/dL (ref 0.40–1.50)
GFR: 86.54 mL/min (ref >60.00)
Glucose, Bld: 86 mg/dL (ref 70–99)
Potassium: 5.2 meq/L — ABNORMAL HIGH (ref 3.5–5.1)
Sodium: 139 meq/L (ref 135–145)
Total Bilirubin: 0.6 mg/dL (ref 0.2–1.2)
Total Protein: 7.2 g/dL (ref 6.0–8.3)

## 2023-03-28 LAB — LIPID PANEL
Cholesterol: 257 mg/dL — ABNORMAL HIGH (ref 0–200)
HDL: 51.6 mg/dL (ref >39.00)
NonHDL: 205.79
Total CHOL/HDL Ratio: 5
Triglycerides: 272 mg/dL — ABNORMAL HIGH (ref 0.0–149.0)
VLDL: 54.4 mg/dL — ABNORMAL HIGH (ref 0.0–40.0)

## 2023-03-28 LAB — HEMOGLOBIN A1C: Hgb A1c MFr Bld: 5.9 % (ref 4.6–6.5)

## 2023-03-28 MED ORDER — TAMSULOSIN HCL 0.4 MG PO CAPS: 0.4000 mg | ORAL_CAPSULE | Freq: Every day | ORAL | 3 refills | Status: DC

## 2023-03-28 NOTE — Progress Notes (Signed)
Lee [name] 77 y.o.   Chief Complaint  Patient presents with   Annual Exam    HISTORY OF PRESENT ILLNESS: This is a 77 y.o. male here for annual exam. Also complaining of nocturia.  Getting up 3-4 times a night to urinate Recently had diffuse abdominal pain related to eating out. No other complaints or medical concerns today.  HPI   Prior to Admission medications   Medication Sig Start Date End Date Taking? Authorizing Provider  BESIVANCE 0.6 % SUSP Apply 1 drop to eye 3 (three) times daily. Patient not taking: Reported on 11/16/2020 04/15/20   [provider]  DUREZOL 0.05 % EMUL Apply to eye. Patient not taking: Reported on 11/16/2020 04/15/20   [provider]  metroNIDAZOLE (METROGEL) 0.75 % gel Apply 1 application topically 2 (two) times daily. Patient not taking: Reported on 03/28/2023 05/13/18   Shade Flood, MD  PROLENSA 0.07 % SOLN Apply 1 drop to eye 2 (two) times daily. Patient not taking: Reported on 11/16/2020 04/15/20   [provider]  pyridOXINE (VITAMIN B-6) 100 MG tablet Take 100 mg by mouth daily. Reported on 11/02/2015 Patient not taking: Reported on 03/28/2023    [provider]    No Known Allergies  There are no problems to display for this patient.   Past Medical History:  Diagnosis Date   Cataract    Kidney stones     Past Surgical History:  Procedure Laterality Date   CATARACT EXTRACTION, BILATERAL     COLONOSCOPY  2016   POLYPECTOMY  2016   tubular adenoma    Social History   Socioeconomic History   Marital status: Unknown    Spouse name: Not on file   Number of children: 2   Years of education: Not on file   Highest education level: Not on file  Occupational History   Occupation: resturant  Tobacco Use   Smoking status: Never   Smokeless tobacco: Never  Substance and Sexual Activity   Alcohol use: No    Alcohol/week: 0.0 standard drinks of alcohol   Drug use: No   Sexual activity: Not on  file  Other Topics Concern   Not on file  Social History Narrative   Not on file   Social Determinants of Health   Financial Resource Strain: Not on file  Food Insecurity: Not on file  Transportation Needs: Not on file  Physical Activity: Not on file  Stress: Not on file  Social Connections: Not on file  Intimate Partner Violence: Not on file    Family History  Problem Relation Age of Onset   Stomach cancer Father    Colon cancer Neg Hx    Colon polyps Neg Hx    Esophageal cancer Neg Hx      Review of Systems  Constitutional: Negative.  Negative for chills and fever.  HENT: Negative.  Negative for congestion and sore throat.   Respiratory: Negative.  Negative for cough and shortness of breath.   Cardiovascular: Negative.  Negative for chest pain and palpitations.  Gastrointestinal:  Negative for abdominal pain, diarrhea, nausea and vomiting.  Genitourinary:  Positive for frequency.       Nocturia  Skin: Negative.   Neurological: Negative.  Negative for dizziness and headaches.  All other systems reviewed and are negative.   Vitals:   03/28/23 0924  BP: 120/78  Pulse: (!) 58  Temp: (!) 97.5 F (36.4 C)  SpO2: 100%    Physical Exam Vitals reviewed.  Constitutional:      Appearance: Normal appearance.  HENT:     Head: Normocephalic.     Right Ear: Tympanic membrane, ear canal and external ear normal.     Left Ear: Tympanic membrane, ear canal and external ear normal.     Mouth/Throat:     Mouth: Mucous membranes are moist.     Pharynx: Oropharynx is clear.  Eyes:     Extraocular Movements: Extraocular movements intact.     Conjunctiva/sclera: Conjunctivae normal.     Pupils: Pupils are equal, round, and reactive to light.  Cardiovascular:     Rate and Rhythm: Normal rate and regular rhythm.     Pulses: Normal pulses.     Heart sounds: Normal heart sounds.  Pulmonary:     Effort: Pulmonary effort is normal.     Breath sounds: Normal breath sounds.   Abdominal:     Palpations: Abdomen is soft.     Tenderness: There is no abdominal tenderness.  Musculoskeletal:     Cervical back: No tenderness.  Lymphadenopathy:     Cervical: No cervical adenopathy.  Skin:    General: Skin is warm and dry.     Capillary Refill: Capillary refill takes less than 2 seconds.  Neurological:     General: No focal deficit present.     Mental Status: He is alert and oriented to person, place, and time.  Psychiatric:        Mood and Affect: Mood normal.        Behavior: Behavior normal.      ASSESSMENT & PLAN: Problem List Items Addressed This Visit       Other   Lower urinary tract symptoms    Active and affecting quality of life.  Sleep in particular due to nocturia. Recommend to start Flomax 0.4 mg at bedtime PSA done today. May need urology referral      Relevant Medications   tamsulosin (FLOMAX) 0.4 MG CAPS capsule   Other Relevant Orders   PSA(Must document that pt has been informed of limitations of PSA testing.)   History of colonic polyps    Colonoscopy report October 2021 reviewed. Recommendation was to repeat colonoscopy in 3 years Referral placed today      Relevant Orders   Ambulatory referral to Gastroenterology   Other Visit Diagnoses     Encounter for general adult medical examination with abnormal findings    -  Primary   Relevant Orders   CBC with Differential   Comprehensive metabolic panel   Hemoglobin A1c   Lipid panel   PSA(Must document that pt has been informed of limitations of PSA testing.)   Screening for prostate cancer       Relevant Orders   PSA(Must document that pt has been informed of limitations of PSA testing.)   Colon cancer screening       Relevant Orders   Ambulatory referral to Gastroenterology   Screening for deficiency anemia       Relevant Orders   CBC with Differential   Screening for lipoid disorders       Relevant Orders   Lipid panel   Screening for endocrine, metabolic and  immunity disorder       Relevant Orders   Comprehensive metabolic panel   Hemoglobin A1c      Modifiable risk factors discussed with patient. Anticipatory guidance according to age provided. The following topics were also discussed: Social Determinants of Health Smoking.  Non-smoker Diet and nutrition Benefits of  exercise Cancer screening and need for colonoscopy this year Vaccinations review and recommendations Cardiovascular risk assessment Mental health including depression and anxiety Fall and accident prevention  Patient Instructions  Health Maintenance, Male Adopting a healthy lifestyle and getting preventive care are important in promoting health and wellness. Ask your health care provider about: The right schedule for you to have regular tests and exams. Things you can do on your own to prevent diseases and keep yourself healthy. What should I know about diet, weight, and exercise? Eat a healthy diet  Eat a diet that includes plenty of vegetables, fruits, low-fat dairy products, and lean protein. Do not eat a lot of foods that are high in solid fats, added sugars, or sodium. Maintain a healthy weight Body mass index (BMI) is a measurement that can be used to identify possible weight problems. It estimates body fat based on height and weight. Your health care provider can help determine your BMI and help you achieve or maintain a healthy weight. Get regular exercise Get regular exercise. This is one of the most important things you can do for your health. Most adults should: Exercise for at least 150 minutes each week. The exercise should increase your heart rate and make you sweat (moderate-intensity exercise). Do strengthening exercises at least twice a week. This is in addition to the moderate-intensity exercise. Spend less time sitting. Even light physical activity can be beneficial. Watch cholesterol and blood lipids Have your blood tested for lipids and cholesterol  at 76 years of age, then have this test every 5 years. You may need to have your cholesterol levels checked more often if: Your lipid or cholesterol levels are high. You are older than 76 years of age. You are at high risk for heart disease. What should I know about cancer screening? Many types of cancers can be detected early and may often be prevented. Depending on your health history and family history, you may need to have cancer screening at various ages. This may include screening for: Colorectal cancer. Prostate cancer. Skin cancer. Lung cancer. What should I know about heart disease, diabetes, and high blood pressure? Blood pressure and heart disease High blood pressure causes heart disease and increases the risk of stroke. This is more likely to develop in people who have high blood pressure readings or are overweight. Talk with your health care provider about your target blood pressure readings. Have your blood pressure checked: Every 3-5 years if you are 47-64 years of age. Every year if you are 40 years old or older. If you are between the ages of 73 and 79 and are a current or former smoker, ask your health care provider if you should have a one-time screening for abdominal aortic aneurysm (AAA). Diabetes Have regular diabetes screenings. This checks your fasting blood sugar level. Have the screening done: Once every three years after age 4 if you are at a normal weight and have a low risk for diabetes. More often and at a younger age if you are overweight or have a high risk for diabetes. What should I know about preventing infection? Hepatitis B If you have a higher risk for hepatitis B, you should be screened for this virus. Talk with your health care provider to find out if you are at risk for hepatitis B infection. Hepatitis C Blood testing is recommended for: Everyone born from [birth year] through 1965. Anyone with known risk factors for hepatitis C. Sexually transmitted  infections (STIs) You should be screened  each year for STIs, including gonorrhea and chlamydia, if: You are sexually active and are younger than 76 years of age. You are older than 76 years of age and your health care provider tells you that you are at risk for this type of infection. Your sexual activity has changed since you were last screened, and you are at increased risk for chlamydia or gonorrhea. Ask your health care provider if you are at risk. Ask your health care provider about whether you are at high risk for HIV. Your health care provider may recommend a prescription medicine to help prevent HIV infection. If you choose to take medicine to prevent HIV, you should first get tested for HIV. You should then be tested every 3 months for as long as you are taking the medicine. Follow these instructions at home: Alcohol use Do not drink alcohol if your health care provider tells you not to drink. If you drink alcohol: Limit how much you have to 0-2 drinks a day. Know how much alcohol is in your drink. In the [place] one drink equals one 12 oz bottle of beer (355 mL), one 5 oz glass of wine (148 mL), or one 1 oz glass of hard liquor (44 mL). Lifestyle Do not use any products that contain nicotine or tobacco. These products include cigarettes, chewing tobacco, and vaping devices, such as e-cigarettes. If you need help quitting, ask your health care provider. Do not use street drugs. Do not share needles. Ask your health care provider for help if you need support or information about quitting drugs. General instructions Schedule regular health, dental, and eye exams. Stay current with your vaccines. Tell your health care provider if: You often feel depressed. You have ever been abused or do not feel safe at home. Summary Adopting a healthy lifestyle and getting preventive care are important in promoting health and wellness. Follow your health care provider's instructions about healthy  diet, exercising, and getting tested or screened for diseases. Follow your health care provider's instructions on monitoring your cholesterol and blood pressure. This information is not intended to replace advice given to you by your health care provider. Make sure you discuss any questions you have with your health care provider. Document Revised: 12/13/2020 Document Reviewed: 12/13/2020 Elsevier Patient Education  2024 Elsevier Inc.     Edwina Barth, MD [facility]

## 2023-03-28 NOTE — Patient Instructions (Signed)
Health Maintenance, Male Adopting a healthy lifestyle and getting preventive care are important in promoting health and wellness. Ask your health care provider about: The right schedule for you to have regular tests and exams. Things you can do on your own to prevent diseases and keep yourself healthy. What should I know about diet, weight, and exercise? Eat a healthy diet  Eat a diet that includes plenty of vegetables, fruits, low-fat dairy products, and lean protein. Do not eat a lot of foods that are high in solid fats, added sugars, or sodium. Maintain a healthy weight Body mass index (BMI) is a measurement that can be used to identify possible weight problems. It estimates body fat based on height and weight. Your health care provider can help determine your BMI and help you achieve or maintain a healthy weight. Get regular exercise Get regular exercise. This is one of the most important things you can do for your health. Most adults should: Exercise for at least 150 minutes each week. The exercise should increase your heart rate and make you sweat (moderate-intensity exercise). Do strengthening exercises at least twice a week. This is in addition to the moderate-intensity exercise. Spend less time sitting. Even light physical activity can be beneficial. Watch cholesterol and blood lipids Have your blood tested for lipids and cholesterol at 76 years of age, then have this test every 5 years. You may need to have your cholesterol levels checked more often if: Your lipid or cholesterol levels are high. You are older than 76 years of age. You are at high risk for heart disease. What should I know about cancer screening? Many types of cancers can be detected early and may often be prevented. Depending on your health history and family history, you may need to have cancer screening at various ages. This may include screening for: Colorectal cancer. Prostate cancer. Skin cancer. Lung  cancer. What should I know about heart disease, diabetes, and high blood pressure? Blood pressure and heart disease High blood pressure causes heart disease and increases the risk of stroke. This is more likely to develop in people who have high blood pressure readings or are overweight. Talk with your health care provider about your target blood pressure readings. Have your blood pressure checked: Every 3-5 years if you are 18-39 years of age. Every year if you are 40 years old or older. If you are between the ages of 65 and 75 and are a current or former smoker, ask your health care provider if you should have a one-time screening for abdominal aortic aneurysm (AAA). Diabetes Have regular diabetes screenings. This checks your fasting blood sugar level. Have the screening done: Once every three years after age 45 if you are at a normal weight and have a low risk for diabetes. More often and at a younger age if you are overweight or have a high risk for diabetes. What should I know about preventing infection? Hepatitis B If you have a higher risk for hepatitis B, you should be screened for this virus. Talk with your health care provider to find out if you are at risk for hepatitis B infection. Hepatitis C Blood testing is recommended for: Everyone born from 1945 through 1965. Anyone with known risk factors for hepatitis C. Sexually transmitted infections (STIs) You should be screened each year for STIs, including gonorrhea and chlamydia, if: You are sexually active and are younger than 76 years of age. You are older than 76 years of age and your   health care provider tells you that you are at risk for this type of infection. Your sexual activity has changed since you were last screened, and you are at increased risk for chlamydia or gonorrhea. Ask your health care provider if you are at risk. Ask your health care provider about whether you are at high risk for HIV. Your health care provider  may recommend a prescription medicine to help prevent HIV infection. If you choose to take medicine to prevent HIV, you should first get tested for HIV. You should then be tested every 3 months for as long as you are taking the medicine. Follow these instructions at home: Alcohol use Do not drink alcohol if your health care provider tells you not to drink. If you drink alcohol: Limit how much you have to 0-2 drinks a day. Know how much alcohol is in your drink. In the [place] one drink equals one 12 oz bottle of beer (355 mL), one 5 oz glass of wine (148 mL), or one 1 oz glass of hard liquor (44 mL). Lifestyle Do not use any products that contain nicotine or tobacco. These products include cigarettes, chewing tobacco, and vaping devices, such as e-cigarettes. If you need help quitting, ask your health care provider. Do not use street drugs. Do not share needles. Ask your health care provider for help if you need support or information about quitting drugs. General instructions Schedule regular health, dental, and eye exams. Stay current with your vaccines. Tell your health care provider if: You often feel depressed. You have ever been abused or do not feel safe at home. Summary Adopting a healthy lifestyle and getting preventive care are important in promoting health and wellness. Follow your health care provider's instructions about healthy diet, exercising, and getting tested or screened for diseases. Follow your health care provider's instructions on monitoring your cholesterol and blood pressure. This information is not intended to replace advice given to you by your health care provider. Make sure you discuss any questions you have with your health care provider. Document Revised: 12/13/2020 Document Reviewed: 12/13/2020 Elsevier Patient Education  2024 Elsevier Inc.  

## 2023-03-28 NOTE — Assessment & Plan Note (Signed)
Active and affecting quality of life.  Sleep in particular due to nocturia. Recommend to start Flomax 0.4 mg at bedtime PSA done today. May need urology referral

## 2023-03-28 NOTE — Assessment & Plan Note (Signed)
Colonoscopy report October 2021 reviewed. Recommendation was to repeat colonoscopy in 3 years Referral placed today

## 2023-03-30 LAB — PSA: PSA: 2.16 ng/mL (ref 0.10–4.00)

## 2023-04-01 ENCOUNTER — Other Ambulatory Visit: Payer: Self-pay | Admitting: Emergency Medicine

## 2023-04-01 DIAGNOSIS — E785 Hyperlipidemia, unspecified: Secondary | ICD-10-CM | POA: Insufficient documentation

## 2023-04-01 MED ORDER — ROSUVASTATIN CALCIUM 10 MG PO TABS: 10.0000 mg | ORAL_TABLET | Freq: Every day | ORAL | 3 refills | Status: DC

## 2023-04-10 ENCOUNTER — Encounter: Payer: Self-pay | Admitting: Internal Medicine

## 2023-04-18 ENCOUNTER — Encounter: Payer: Self-pay | Admitting: Internal Medicine

## 2023-05-25 ENCOUNTER — Ambulatory Visit: Payer: Medicare PPO

## 2023-05-25 VITALS — Ht 62.0 in | Wt 158.8 lb

## 2023-05-25 DIAGNOSIS — Z860100 Personal history of colon polyps, unspecified: Secondary | ICD-10-CM

## 2023-05-25 MED ORDER — NA SULFATE-K SULFATE-MG SULF 17.5-3.13-1.6 GM/177ML PO SOLN
1.0000 | Freq: Once | ORAL | 0 refills | Status: AC
Start: 2023-05-25 — End: 2023-05-25

## 2023-05-25 NOTE — Progress Notes (Signed)

## 2023-05-30 ENCOUNTER — Encounter: Payer: Self-pay | Admitting: Internal Medicine

## 2023-06-12 ENCOUNTER — Ambulatory Visit (AMBULATORY_SURGERY_CENTER): Payer: Medicare PPO | Admitting: Internal Medicine

## 2023-06-12 ENCOUNTER — Encounter: Payer: Self-pay | Admitting: Internal Medicine

## 2023-06-12 VITALS — BP 137/66 | HR 59 | Resp 12

## 2023-06-12 DIAGNOSIS — Z860100 Personal history of colon polyps, unspecified: Secondary | ICD-10-CM

## 2023-06-12 DIAGNOSIS — Z1211 Encounter for screening for malignant neoplasm of colon: Secondary | ICD-10-CM

## 2023-06-12 DIAGNOSIS — Z860101 Personal history of adenomatous and serrated colon polyps: Secondary | ICD-10-CM | POA: Diagnosis not present

## 2023-06-12 MED ORDER — SODIUM CHLORIDE 0.9 % IV SOLN: 500.0000 mL | Freq: Once | INTRAVENOUS | Status: DC

## 2023-06-12 NOTE — Progress Notes (Signed)
Pt's states no medical or surgical changes since previsit or office visit. 

## 2023-06-12 NOTE — Progress Notes (Signed)
HISTORY OF PRESENT ILLNESS:  Lee [name] is a 77 y.o. male with a history of multiple adenomatous colon polyps.  Presents today for surveillance colonoscopy.  Previous examinations 2016, 2021  REVIEW OF SYSTEMS:  All non-GI ROS negative except for  Past Medical History:  Diagnosis Date   Cataract    Kidney stones     Past Surgical History:  Procedure Laterality Date   CATARACT EXTRACTION, BILATERAL     COLONOSCOPY  2016   POLYPECTOMY  2016   tubular adenoma    Social History Lee [name]  reports that he has never smoked. He has never used smokeless tobacco. He reports that he does not drink alcohol and does not use drugs.  family history includes Stomach cancer in his father.  No Known Allergies     PHYSICAL EXAMINATION: Vital signs: There were no vitals taken for this visit. General: Well-developed, well-nourished, no acute distress HEENT: Sclerae are anicteric, conjunctiva pink. Oral mucosa intact Lungs: Clear Heart: Regular Abdomen: soft, nontender, nondistended, no obvious ascites, no peritoneal signs, normal bowel sounds. No organomegaly. Extremities: No edema Psychiatric: alert and oriented x3. Cooperative     ASSESSMENT:  Personal history of multiple adenomatous colon polyps   PLAN:   Surveillance colonoscopy

## 2023-06-12 NOTE — Op Note (Signed)
[facility] Patient Name: Lee [name] Procedure Date: 06/12/2023 10:02 AM MRN: [id number] Endoscopist: Wilhemina Bonito. Marina Goodell , MD, [id number] Age: 77 Referring MD:  Date of Birth: [birth year] Gender: Male Account #: [id number] Procedure:                Colonoscopy Indications:              High risk colon cancer surveillance: Personal                            history of multiple (3 or more) adenomas. Previous                            examinations 2016, 2021 Medicines:                Monitored Anesthesia Care Procedure:                Pre-Anesthesia Assessment:                           - Prior to the procedure, a History and Physical                            was performed, and patient medications and                            allergies were reviewed. The patient's tolerance of                            previous anesthesia was also reviewed. The risks                            and benefits of the procedure and the sedation                            options and risks were discussed with the patient.                            All questions were answered, and informed consent                            was obtained. Prior Anticoagulants: The patient has                            taken no anticoagulant or antiplatelet agents. ASA                            Grade Assessment: II - A patient with mild systemic                            disease. After reviewing the risks and benefits,                            the patient was deemed in satisfactory condition to  undergo the procedure.                           After obtaining informed consent, the colonoscope                            was passed under direct vision. Throughout the                            procedure, the patient's blood pressure, pulse, and                            oxygen saturations were monitored continuously. The                            Olympus Scope SN: [id number] was  introduced through                            the anus and advanced to the the cecum, identified                            by appendiceal orifice and ileocecal valve. The                            ileocecal valve, appendiceal orifice, and rectum                            were photographed. The quality of the bowel                            preparation was excellent. The colonoscopy was                            performed without difficulty. The patient tolerated                            the procedure well. The bowel preparation used was                            SUPREP via split dose instruction. Scope In: 10:18:41 AM Scope Out: 10:28:28 AM Scope Withdrawal Time: 0 hours 7 minutes 25 seconds  Total Procedure Duration: 0 hours 9 minutes 47 seconds  Findings:                 The exam was otherwise without abnormality on                            direct and retroflexion views. Internal hemorrhoids. Complications:            No immediate complications. Estimated blood loss:                            None. Estimated Blood Loss:     Estimated blood loss: none. Impression:               - The  examination was otherwise normal on direct                            and retroflexion views. Internal hemorrhoids.                           - No specimens collected. Recommendation:           - Repeat colonoscopy is not recommended for                            surveillance.                           - Patient has a contact number available for                            emergencies. The signs and symptoms of potential                            delayed complications were discussed with the                            patient. Return to normal activities tomorrow.                            Written discharge instructions were provided to the                            patient.                           - Resume previous diet.                           - Continue present medications. Wilhemina Bonito.  Marina Goodell, MD 06/12/2023 10:32:53 AM This report has been signed electronically.

## 2023-06-12 NOTE — Patient Instructions (Signed)
REPEAT Colonoscopy is not recommended for surveillance.  YOU HAD AN ENDOSCOPIC PROCEDURE TODAY AT THE  ENDOSCOPY CENTER:   Refer to the procedure report that was given to you for any specific questions about what was found during the examination.  If the procedure report does not answer your questions, please call your gastroenterologist to clarify.  If you requested that your care partner not be given the details of your procedure findings, then the procedure report has been included in a sealed envelope for you to review at your convenience later.  YOU SHOULD EXPECT: Some feelings of bloating in the abdomen. Passage of more gas than usual.  Walking can help get rid of the air that was put into your GI tract during the procedure and reduce the bloating. If you had a lower endoscopy (such as a colonoscopy or flexible sigmoidoscopy) you may notice spotting of blood in your stool or on the toilet paper. If you underwent a bowel prep for your procedure, you may not have a normal bowel movement for a few days.  Please Note:  You might notice some irritation and congestion in your nose or some drainage.  This is from the oxygen used during your procedure.  There is no need for concern and it should clear up in a day or so.  SYMPTOMS TO REPORT IMMEDIATELY:  Following lower endoscopy (colonoscopy or flexible sigmoidoscopy):  Excessive amounts of blood in the stool  Significant tenderness or worsening of abdominal pains  Swelling of the abdomen that is new, acute  Fever of 100F or higher  For urgent or emergent issues, a gastroenterologist can be reached at any hour by calling [phone]. Do not use MyChart messaging for urgent concerns.    DIET:  We do recommend a small meal at first, but then you may proceed to your regular diet.  Drink plenty of fluids but you should avoid alcoholic beverages for 24 hours.  ACTIVITY:  You should plan to take it easy for the rest of today and you should  NOT DRIVE or use heavy machinery until tomorrow (because of the sedation medicines used during the test).    FOLLOW UP: Our staff will call the number listed on your records the next business day following your procedure.  We will call around 7:15- 8:00 am to check on you and address any questions or concerns that you may have regarding the information given to you following your procedure. If we do not reach you, we will leave a message.     If any biopsies were taken you will be contacted by phone or by letter within the next 1-3 weeks.  Please call us at [phone] if you have not heard about the biopsies in 3 weeks.    SIGNATURES/CONFIDENTIALITY: You and/or your care partner have signed paperwork which will be entered into your electronic medical record.  These signatures attest to the fact that that the information above on your After Visit Summary has been reviewed and is understood.  Full responsibility of the confidentiality of this discharge information lies with you and/or your care-partner.

## 2023-06-12 NOTE — Progress Notes (Signed)
Vss nad trans to pacu 

## 2023-06-13 ENCOUNTER — Telehealth: Payer: Self-pay

## 2023-06-13 NOTE — Telephone Encounter (Signed)
  Follow up Call-     06/12/2023    9:19 AM  Call back number  Post procedure Call Back phone  # [phone]  Permission to leave phone message Yes     Patient questions:  Do you have a fever, pain , or abdominal swelling? No. Pain Score  0 *  Have you tolerated food without any problems? Yes.    Have you been able to return to your normal activities? Yes.    Do you have any questions about your discharge instructions: Diet   No. Medications  No. Follow up visit  No.  Do you have questions or concerns about your Care? No.  Actions: * If pain score is 4 or above: No action needed, pain <4.

## 2023-09-07 ENCOUNTER — Encounter (HOSPITAL_COMMUNITY): Payer: Self-pay

## 2023-09-07 ENCOUNTER — Ambulatory Visit: Payer: Medicare PPO | Admitting: Family Medicine

## 2023-09-07 ENCOUNTER — Ambulatory Visit (HOSPITAL_COMMUNITY)
Admission: EM | Admit: 2023-09-07 | Discharge: 2023-09-07 | Disposition: A | Discharge status: Home / Self Care | Payer: Medicare PPO | Attending: Physician Assistant | Admitting: Physician Assistant

## 2023-09-07 DIAGNOSIS — M5441 Lumbago with sciatica, right side: Secondary | ICD-10-CM | POA: Diagnosis not present

## 2023-09-07 HISTORY — DX: Pure hypercholesterolemia, unspecified: E78.00

## 2023-09-07 MED ORDER — MELOXICAM 7.5 MG PO TABS: 7.5000 mg | ORAL_TABLET | Freq: Every day | ORAL | 0 refills | Status: DC

## 2023-09-07 NOTE — ED Provider Notes (Addendum)
[facility]    CSN: [id number] Arrival date & time: 09/07/23  1229      History   Chief Complaint Chief Complaint  Patient presents with   Back Pain    HPI Lee [name] is a 78 y.o. male.   HPI   Lower back pain    Onset: sudden  Duration: started this AM when he woke up  Location:lower right side of back  He denies recent injury or heavy lifting, denies trauma or recent accidents  Radiation: when he bends over the pain travels down the back of right leg  Pain level and character: 6/10 sharp in nature. Can get up to 8/10 with standing  Other associated symptoms: he denies numbness, saddle anesthesia, incontinence  Interventions: Tylenol  Alleviating: Tylenol seemed to improve it this AM  Aggravating: bending, standing up from sitting        Past Medical History:  Diagnosis Date   Cataract    High cholesterol    Kidney stones     Patient Active Problem List   Diagnosis Date Noted   Dyslipidemia 04/01/2023   Lower urinary tract symptoms 03/28/2023   History of colonic polyps 03/28/2023    Past Surgical History:  Procedure Laterality Date   CATARACT EXTRACTION, BILATERAL     COLONOSCOPY  2016   POLYPECTOMY  2016   tubular adenoma       Home Medications    Prior to Admission medications   Medication Sig Start Date End Date Taking? Authorizing Provider  BESIVANCE 0.6 % SUSP Apply 1 drop to eye 3 (three) times daily. Patient not taking: Reported on 05/25/2023 04/15/20   [provider]  DUREZOL 0.05 % EMUL Apply to eye. Patient not taking: Reported on 11/16/2020 04/15/20   [provider]  metroNIDAZOLE (METROGEL) 0.75 % gel Apply 1 application topically 2 (two) times daily. Patient not taking: Reported on 03/28/2023 05/13/18   Shade Flood, MD  PROLENSA 0.07 % SOLN Apply 1 drop to eye 2 (two) times daily. Patient not taking: Reported on 11/16/2020 04/15/20   [provider]  pyridOXINE (VITAMIN B-6) 100 MG  tablet Take 100 mg by mouth daily. Reported on 11/02/2015 Patient not taking: Reported on 03/28/2023    [provider]  rosuvastatin (CRESTOR) 10 MG tablet Take 1 tablet (10 mg total) by mouth daily. 04/01/23   Georgina Quint, MD  tamsulosin (FLOMAX) 0.4 MG CAPS capsule Take 1 capsule (0.4 mg total) by mouth daily. 03/28/23   Georgina Quint, MD    Family History Family History  Problem Relation Age of Onset   Stomach cancer Father    Colon cancer Neg Hx    Colon polyps Neg Hx    Esophageal cancer Neg Hx    Rectal cancer Neg Hx     Social History Social History   Tobacco Use   Smoking status: Never   Smokeless tobacco: Never  Vaping Use   Vaping status: Never Used  Substance Use Topics   Alcohol use: No    Alcohol/week: 0.0 standard drinks of alcohol   Drug use: No     Allergies   Patient has no known allergies.   Review of Systems Review of Systems  Genitourinary:  Positive for frequency (ongoing prior to back pain). Negative for dysuria and urgency.  Musculoskeletal:  Positive for back pain. Negative for gait problem.     Physical Exam Triage Vital Signs ED Triage Vitals  Encounter Vitals Group  BP 09/07/23 1433 (!) 146/87     Systolic BP Percentile --      Diastolic BP Percentile --      Pulse Rate 09/07/23 1433 61     Resp 09/07/23 1433 16     Temp 09/07/23 1433 98.1 F (36.7 C)     Temp Source 09/07/23 1433 Oral     SpO2 09/07/23 1433 94 %     Weight --      Height --      Head Circumference --      Peak Flow --      Pain Score 09/07/23 1432 6     Pain Loc --      Pain Education --      Exclude from Growth Chart --    No data found.  Updated Vital Signs BP (!) 146/87 (BP Location: Right Arm)   Pulse 61   Temp 98.1 F (36.7 C) (Oral)   Resp 16   SpO2 94%   Visual Acuity Right Eye Distance:   Left Eye Distance:   Bilateral Distance:    Right Eye Near:   Left Eye Near:    Bilateral Near:     Physical  Exam Vitals reviewed.  Constitutional:      General: He is awake.     Appearance: Normal appearance. He is well-developed and well-groomed.  HENT:     Head: Normocephalic and atraumatic.  Pulmonary:     Effort: Pulmonary effort is normal.  Musculoskeletal:     Cervical back: Normal, normal range of motion and neck supple. No swelling or deformity. Normal range of motion.     Lumbar back: Normal. No swelling, deformity, spasms, tenderness or bony tenderness. Normal range of motion. Negative right straight leg raise test and negative left straight leg raise test.     Comments: ROM findings Thoracic: Lateral flexion and lateral rotation are intact and symmetrical Lumbar: Extension, flexion are intact Hips: Extension, flexion, abduction, adduction are symmetrical and intact He reports pain with forward flexion at the hips     Skin:    General: Skin is warm and dry.  Neurological:     General: No focal deficit present.     Mental Status: He is alert and oriented to person, place, and time.  Psychiatric:        Mood and Affect: Mood normal.        Behavior: Behavior normal. Behavior is cooperative.        Thought Content: Thought content normal.        Judgment: Judgment normal.      UC Treatments / Results  Labs (all labs ordered are listed, but only abnormal results are displayed) Labs Reviewed - No data to display  EKG   Radiology No results found.  Procedures Procedures (including critical care time)  Medications Ordered in UC Medications - No data to display  Initial Impression / Assessment and Plan / UC Course  I have reviewed the triage vital signs and the nursing notes.  Pertinent labs & imaging results that were available during my care of the patient were reviewed by me and considered in my medical decision making (see chart for details).      Final Clinical Impressions(s) / UC Diagnoses   Final diagnoses:  Acute right-sided low back pain with  right-sided sciatica   Acute, new concern Patient reports sudden development of lower right-sided back pain with associated right-sided sciatica since this morning. He denies recent trauma, repetitive motion,  previous injury or similar pain in previous instances Suspect potential muscle strain, arthritis, SI joint dysfunction at this time. Will send in prescription for meloxicam 7.5 mg p.o. daily.  Most recent eGFR is >60 and he denies previous hx of GERD, gastric ulcer, current blood thinners. Reviewed with patient that he should take this with food and plenty of water.  Reviewed that he can continue to take Tylenol as needed throughout the day. Will provide stretches for him to do to prevent stiffness.  Also recommend warm compresses, gentle massage as needed or tolerated to assist further with management Follow-up with PCP or return to urgent care as needed for persisting or progressing symptoms   Discharge Instructions      Based on your symptoms and physical exam I believe the following is the cause of your concern today Back pain likely secondary to a strain of your back muscles as well as right -sided sciatica  I recommend the following at this time to help relieve that discomfort:  Rest Warm compresses to the area (20 minutes on, minimum of 30 minutes off) You can alternate Tylenol and Ibuprofen for pain management but Ibuprofen is typically preferred to reduce inflammation.  I have sent in a script for Meloxicam for you to take once per day. DO NOT use other NSAIDs while taking this medication (ibuprofen, motrin, aleve, advil, naproxen, etc)   Gentle stretches and exercises that I have included in your paperwork Try to reduce excess strain to the area and rest as much as possible  Wear supportive shoes and, if you must lift anything, use proper lifting techniques that spare your back.   If these measures do not lead to improvement in your symptoms over the next 2-4 weeks please  let us know        ED Prescriptions   None    PDMP not reviewed this encounter.   Providence Crosby, PA-C 09/07/23 1510    Laine Fonner, Oswaldo Conroy, PA-C 09/07/23 1512

## 2023-09-07 NOTE — ED Triage Notes (Addendum)
Patient reports that he has had bilateral lower back pain R>L.  Patient states worse when he bends over. Patient denies pain radiating down his buttocks or legs.  Patient states he took Tylenol at 0600 today.

## 2023-09-07 NOTE — Discharge Instructions (Addendum)
Based on your symptoms and physical exam I believe the following is the cause of your concern today Back pain likely secondary to a strain of your back muscles as well as right -sided sciatica  I recommend the following at this time to help relieve that discomfort:  Rest Warm compresses to the area (20 minutes on, minimum of 30 minutes off) You can alternate Tylenol and Ibuprofen for pain management but Ibuprofen is typically preferred to reduce inflammation.  I have sent in a script for Meloxicam for you to take once per day. DO NOT use other NSAIDs while taking this medication (ibuprofen, motrin, aleve, advil, naproxen, etc)   Gentle stretches and exercises that I have included in your paperwork Try to reduce excess strain to the area and rest as much as possible  Wear supportive shoes and, if you must lift anything, use proper lifting techniques that spare your back.   If these measures do not lead to improvement in your symptoms over the next 2-4 weeks please let us know

## 2023-09-10 ENCOUNTER — Ambulatory Visit: Payer: Medicare PPO | Admitting: Internal Medicine

## 2023-10-04 ENCOUNTER — Other Ambulatory Visit: Payer: Self-pay | Admitting: Physician Assistant

## 2024-03-25 ENCOUNTER — Ambulatory Visit (INDEPENDENT_AMBULATORY_CARE_PROVIDER_SITE_OTHER): Admitting: Emergency Medicine

## 2024-03-25 ENCOUNTER — Ambulatory Visit: Payer: Self-pay | Admitting: Emergency Medicine

## 2024-03-25 ENCOUNTER — Encounter: Payer: Self-pay | Admitting: Emergency Medicine

## 2024-03-25 VITALS — BP 128/80 | HR 64 | Temp 98.5°F | Ht 62.0 in | Wt 159.0 lb

## 2024-03-25 DIAGNOSIS — Z13220 Encounter for screening for lipoid disorders: Secondary | ICD-10-CM | POA: Diagnosis not present

## 2024-03-25 DIAGNOSIS — Z23 Encounter for immunization: Secondary | ICD-10-CM | POA: Diagnosis not present

## 2024-03-25 DIAGNOSIS — Z125 Encounter for screening for malignant neoplasm of prostate: Secondary | ICD-10-CM | POA: Diagnosis not present

## 2024-03-25 DIAGNOSIS — Z130 Encounter for screening for diseases of the blood and blood-forming organs and certain disorders involving the immune mechanism: Secondary | ICD-10-CM | POA: Diagnosis not present

## 2024-03-25 DIAGNOSIS — Z0000 Encounter for general adult medical examination without abnormal findings: Secondary | ICD-10-CM | POA: Diagnosis not present

## 2024-03-25 DIAGNOSIS — Z13228 Encounter for screening for other metabolic disorders: Secondary | ICD-10-CM

## 2024-03-25 DIAGNOSIS — Z1329 Encounter for screening for other suspected endocrine disorder: Secondary | ICD-10-CM | POA: Diagnosis not present

## 2024-03-25 LAB — CBC WITH DIFFERENTIAL/PLATELET
Basophils Absolute: 0 K/uL (ref 0.0–0.1)
Basophils Relative: 0.5 % (ref 0.0–3.0)
Eosinophils Absolute: 0 K/uL (ref 0.0–0.7)
Eosinophils Relative: 0.8 % (ref 0.0–5.0)
HCT: 38.6 % — ABNORMAL LOW (ref 39.0–52.0)
Hemoglobin: 13.3 g/dL (ref 13.0–17.0)
Lymphocytes Relative: 24.4 % (ref 12.0–46.0)
Lymphs Abs: 1.1 K/uL (ref 0.7–4.0)
MCHC: 34.5 g/dL (ref 30.0–36.0)
MCV: 94.4 fl (ref 78.0–100.0)
Monocytes Absolute: 0.3 K/uL (ref 0.1–1.0)
Monocytes Relative: 6.9 % (ref 3.0–12.0)
Neutro Abs: 3 K/uL (ref 1.4–7.7)
Neutrophils Relative %: 67.4 % (ref 43.0–77.0)
Platelets: 242 K/uL (ref 150.0–400.0)
RBC: 4.09 Mil/uL — ABNORMAL LOW (ref 4.22–5.81)
RDW: 13.4 % (ref 11.5–15.5)
WBC: 4.5 K/uL (ref 4.0–10.5)

## 2024-03-25 LAB — COMPREHENSIVE METABOLIC PANEL WITH GFR
ALT: 14 U/L (ref 0–53)
AST: 20 U/L (ref 0–37)
Albumin: 4.4 g/dL (ref 3.5–5.2)
Alkaline Phosphatase: 50 U/L (ref 39–117)
BUN: 20 mg/dL (ref 6–23)
CO2: 25 meq/L (ref 19–32)
Calcium: 8.9 mg/dL (ref 8.4–10.5)
Chloride: 107 meq/L (ref 96–112)
Creatinine, Ser: 0.86 mg/dL (ref 0.40–1.50)
GFR: 83.76 mL/min (ref >60.00)
Glucose, Bld: 126 mg/dL — ABNORMAL HIGH (ref 70–99)
Potassium: 4.1 meq/L (ref 3.5–5.1)
Sodium: 141 meq/L (ref 135–145)
Total Bilirubin: 0.7 mg/dL (ref 0.2–1.2)
Total Protein: 6.6 g/dL (ref 6.0–8.3)

## 2024-03-25 LAB — LIPID PANEL
Cholesterol: 214 mg/dL — ABNORMAL HIGH (ref 0–200)
HDL: 49.6 mg/dL (ref >39.00)
LDL Cholesterol: 109 mg/dL — ABNORMAL HIGH (ref 0–99)
NonHDL: 164.53
Total CHOL/HDL Ratio: 4
Triglycerides: 276 mg/dL — ABNORMAL HIGH (ref 0.0–149.0)
VLDL: 55.2 mg/dL — ABNORMAL HIGH (ref 0.0–40.0)

## 2024-03-25 LAB — HEMOGLOBIN A1C: Hgb A1c MFr Bld: 6.2 % (ref 4.6–6.5)

## 2024-03-25 LAB — PSA: PSA: 2.39 ng/mL (ref 0.10–4.00)

## 2024-03-25 NOTE — Patient Instructions (Signed)
 Health Maintenance, Male  Adopting a healthy lifestyle and getting preventive care are important in promoting health and wellness. Ask your health care provider about:  The right schedule for you to have regular tests and exams.  Things you can do on your own to prevent diseases and keep yourself healthy.  What should I know about diet, weight, and exercise?  Eat a healthy diet    Eat a diet that includes plenty of vegetables, fruits, low-fat dairy products, and lean protein.  Do not eat a lot of foods that are high in solid fats, added sugars, or sodium.  Maintain a healthy weight  Body mass index (BMI) is a measurement that can be used to identify possible weight problems. It estimates body fat based on height and weight. Your health care provider can help determine your BMI and help you achieve or maintain a healthy weight.  Get regular exercise  Get regular exercise. This is one of the most important things you can do for your health. Most adults should:  Exercise for at least 150 minutes each week. The exercise should increase your heart rate and make you sweat (moderate-intensity exercise).  Do strengthening exercises at least twice a week. This is in addition to the moderate-intensity exercise.  Spend less time sitting. Even light physical activity can be beneficial.  Watch cholesterol and blood lipids  Have your blood tested for lipids and cholesterol at 77 years of age, then have this test every 5 years.  You may need to have your cholesterol levels checked more often if:  Your lipid or cholesterol levels are high.  You are older than 77 years of age.  You are at high risk for heart disease.  What should I know about cancer screening?  Many types of cancers can be detected early and may often be prevented. Depending on your health history and family history, you may need to have cancer screening at various ages. This may include screening for:  Colorectal cancer.  Prostate cancer.  Skin cancer.  Lung  cancer.  What should I know about heart disease, diabetes, and high blood pressure?  Blood pressure and heart disease  High blood pressure causes heart disease and increases the risk of stroke. This is more likely to develop in people who have high blood pressure readings or are overweight.  Talk with your health care provider about your target blood pressure readings.  Have your blood pressure checked:  Every 3-5 years if you are 24-52 years of age.  Every year if you are 3 years old or older.  If you are between the ages of 60 and 72 and are a current or former smoker, ask your health care provider if you should have a one-time screening for abdominal aortic aneurysm (AAA).  Diabetes  Have regular diabetes screenings. This checks your fasting blood sugar level. Have the screening done:  Once every three years after age 66 if you are at a normal weight and have a low risk for diabetes.  More often and at a younger age if you are overweight or have a high risk for diabetes.  What should I know about preventing infection?  Hepatitis B  If you have a higher risk for hepatitis B, you should be screened for this virus. Talk with your health care provider to find out if you are at risk for hepatitis B infection.  Hepatitis C  Blood testing is recommended for:  Everyone born from [birth year] through 1964.  Anyone  with known risk factors for hepatitis C.  Sexually transmitted infections (STIs)  You should be screened each year for STIs, including gonorrhea and chlamydia, if:  You are sexually active and are younger than 77 years of age.  You are older than 77 years of age and your health care provider tells you that you are at risk for this type of infection.  Your sexual activity has changed since you were last screened, and you are at increased risk for chlamydia or gonorrhea. Ask your health care provider if you are at risk.  Ask your health care provider about whether you are at high risk for HIV. Your health care provider  may recommend a prescription medicine to help prevent HIV infection. If you choose to take medicine to prevent HIV, you should first get tested for HIV. You should then be tested every 3 months for as long as you are taking the medicine.  Follow these instructions at home:  Alcohol use  Do not drink alcohol if your health care provider tells you not to drink.  If you drink alcohol:  Limit how much you have to 0-2 drinks a day.  Know how much alcohol is in your drink. In the [place] one drink equals one 12 oz bottle of beer (355 mL), one 5 oz glass of wine (148 mL), or one 1 oz glass of hard liquor (44 mL).  Lifestyle  Do not use any products that contain nicotine or tobacco. These products include cigarettes, chewing tobacco, and vaping devices, such as e-cigarettes. If you need help quitting, ask your health care provider.  Do not use street drugs.  Do not share needles.  Ask your health care provider for help if you need support or information about quitting drugs.  General instructions  Schedule regular health, dental, and eye exams.  Stay current with your vaccines.  Tell your health care provider if:  You often feel depressed.  You have ever been abused or do not feel safe at home.  Summary  Adopting a healthy lifestyle and getting preventive care are important in promoting health and wellness.  Follow your health care provider's instructions about healthy diet, exercising, and getting tested or screened for diseases.  Follow your health care provider's instructions on monitoring your cholesterol and blood pressure.  This information is not intended to replace advice given to you by your health care provider. Make sure you discuss any questions you have with your health care provider.  Document Revised: 12/13/2020 Document Reviewed: 12/13/2020  Elsevier Patient Education  2024 [employer].

## 2024-03-25 NOTE — Progress Notes (Signed)
 Lee [name] 78 y.o.   Chief Complaint  Patient presents with   Annual Exam    Patient here for physical. Patient wants to make sure he is caught up on his immunizations, will check [facility].     HISTORY OF PRESENT ILLNESS: This is a 78 y.o. male here for annual exam. Overall doing well.  Has no complaints or medical concerns today.  HPI   Prior to Admission medications   Medication Sig Start Date End Date Taking? Authorizing Provider  tamsulosin  (FLOMAX ) 0.4 MG CAPS capsule Take 1 capsule (0.4 mg total) by mouth daily. 03/28/23  Yes Deijah Spikes, Emil Schanz, MD  BESIVANCE 0.6 % SUSP Apply 1 drop to eye 3 (three) times daily. Patient not taking: Reported on 05/25/2023 04/15/20   [provider]  DUREZOL 0.05 % EMUL Apply to eye. Patient not taking: Reported on 11/16/2020 04/15/20   [provider]  meloxicam  (MOBIC ) 7.5 MG tablet TAKE 1 TABLET(7.5 MG) BY MOUTH DAILY Patient not taking: Reported on 03/25/2024 10/09/23   Mico Spark Jose, MD  metroNIDAZOLE  (METROGEL ) 0.75 % gel Apply 1 application topically 2 (two) times daily. Patient not taking: Reported on 03/28/2023 05/13/18   Levora Reyes SAUNDERS, MD  PROLENSA 0.07 % SOLN Apply 1 drop to eye 2 (two) times daily. Patient not taking: Reported on 11/16/2020 04/15/20   [provider]  pyridOXINE (VITAMIN B-6) 100 MG tablet Take 100 mg by mouth daily. Reported on 11/02/2015 Patient not taking: Reported on 03/28/2023    [provider]  rosuvastatin  (CRESTOR ) 10 MG tablet Take 1 tablet (10 mg total) by mouth daily. Patient not taking: Reported on 03/25/2024 04/01/23   [place] Emil Schanz, MD    No Known Allergies  Patient Active Problem List   Diagnosis Date Noted   Dyslipidemia 04/01/2023   Lower urinary tract symptoms 03/28/2023   History of colonic polyps 03/28/2023    Past Medical History:  Diagnosis Date   Cataract    High cholesterol    Kidney stones     Past Surgical History:  Procedure  Laterality Date   CATARACT EXTRACTION, BILATERAL     COLONOSCOPY  2016   POLYPECTOMY  2016   tubular adenoma    Social History   Socioeconomic History   Marital status: Unknown    Spouse name: Not on file   Number of children: 2   Years of education: Not on file   Highest education level: Not on file  Occupational History   Occupation: resturant  Tobacco Use   Smoking status: Never   Smokeless tobacco: Never  Vaping Use   Vaping status: Never Used  Substance and Sexual Activity   Alcohol use: No    Alcohol/week: 0.0 standard drinks of alcohol   Drug use: No   Sexual activity: Not on file  Other Topics Concern   Not on file  Social History Narrative   Not on file   Social Drivers of Health   Financial Resource Strain: Not on file  Food Insecurity: Not on file  Transportation Needs: Not on file  Physical Activity: Not on file  Stress: Not on file  Social Connections: Not on file  Intimate Partner Violence: Not on file    Family History  Problem Relation Age of Onset   Stomach cancer Father    Colon cancer Neg Hx    Colon polyps Neg Hx    Esophageal cancer Neg Hx    Rectal cancer Neg Hx  Review of Systems  Constitutional: Negative.  Negative for chills and fever.  HENT: Negative.  Negative for congestion and sore throat.   Respiratory: Negative.  Negative for cough and shortness of breath.   Cardiovascular: Negative.  Negative for chest pain and palpitations.  Gastrointestinal:  Negative for abdominal pain, diarrhea, nausea and vomiting.  Genitourinary: Negative.  Negative for dysuria and hematuria.  Skin: Negative.  Negative for rash.  Neurological: Negative.  Negative for dizziness and headaches.  All other systems reviewed and are negative.   Vitals:   03/25/24 0859  BP: 128/80  Pulse: 64  Temp: 98.5 F (36.9 C)  SpO2: 94%    Physical Exam Vitals reviewed.  Constitutional:      Appearance: Normal appearance.  HENT:     Head:  Normocephalic.     Right Ear: Tympanic membrane, ear canal and external ear normal.     Left Ear: Tympanic membrane, ear canal and external ear normal.     Mouth/Throat:     Mouth: Mucous membranes are moist.     Pharynx: Oropharynx is clear.  Eyes:     Extraocular Movements: Extraocular movements intact.     Conjunctiva/sclera: Conjunctivae normal.     Pupils: Pupils are equal, round, and reactive to light.  Cardiovascular:     Rate and Rhythm: Normal rate and regular rhythm.     Pulses: Normal pulses.     Heart sounds: Normal heart sounds.  Pulmonary:     Effort: Pulmonary effort is normal.     Breath sounds: Normal breath sounds.  Abdominal:     Palpations: Abdomen is soft.     Tenderness: There is no abdominal tenderness.  Musculoskeletal:     Cervical back: No tenderness.  Lymphadenopathy:     Cervical: No cervical adenopathy.  Skin:    General: Skin is warm and dry.  Neurological:     General: No focal deficit present.     Mental Status: He is alert and oriented to person, place, and time.  Psychiatric:        Mood and Affect: Mood normal.        Behavior: Behavior normal.      ASSESSMENT & PLAN: Problem List Items Addressed This Visit   None Visit Diagnoses       Routine general medical examination at a health care facility    -  Primary   Relevant Orders   CBC with Differential/Platelet   Comprehensive metabolic panel with GFR   Hemoglobin A1c   Lipid panel   PSA     Screening for deficiency anemia       Relevant Orders   CBC with Differential/Platelet     Screening for lipoid disorders       Relevant Orders   Lipid panel     Screening for endocrine, metabolic and immunity disorder       Relevant Orders   Comprehensive metabolic panel with GFR   Hemoglobin A1c     Screening for prostate cancer       Relevant Orders   PSA      Modifiable risk factors discussed with patient. Anticipatory guidance according to age provided. The following topics  were also discussed: Social Determinants of Health Smoking.  Non-smoker Diet and nutrition Benefits of exercise Cancer screening and review of colonoscopy report from 2024 Vaccinations review and recommendations Cardiovascular risk assessment and need for blood work Mental health including depression and anxiety Fall and accident prevention  Patient Instructions  Health  Maintenance, Male Adopting a healthy lifestyle and getting preventive care are important in promoting health and wellness. Ask your health care provider about: The right schedule for you to have regular tests and exams. Things you can do on your own to prevent diseases and keep yourself healthy. What should I know about diet, weight, and exercise? Eat a healthy diet  Eat a diet that includes plenty of vegetables, fruits, low-fat dairy products, and lean protein. Do not eat a lot of foods that are high in solid fats, added sugars, or sodium. Maintain a healthy weight Body mass index (BMI) is a measurement that can be used to identify possible weight problems. It estimates body fat based on height and weight. Your health care provider can help determine your BMI and help you achieve or maintain a healthy weight. Get regular exercise Get regular exercise. This is one of the most important things you can do for your health. Most adults should: Exercise for at least 150 minutes each week. The exercise should increase your heart rate and make you sweat (moderate-intensity exercise). Do strengthening exercises at least twice a week. This is in addition to the moderate-intensity exercise. Spend less time sitting. Even light physical activity can be beneficial. Watch cholesterol and blood lipids Have your blood tested for lipids and cholesterol at 77 years of age, then have this test every 5 years. You may need to have your cholesterol levels checked more often if: Your lipid or cholesterol levels are high. You are older than  77 years of age. You are at high risk for heart disease. What should I know about cancer screening? Many types of cancers can be detected early and may often be prevented. Depending on your health history and family history, you may need to have cancer screening at various ages. This may include screening for: Colorectal cancer. Prostate cancer. Skin cancer. Lung cancer. What should I know about heart disease, diabetes, and high blood pressure? Blood pressure and heart disease High blood pressure causes heart disease and increases the risk of stroke. This is more likely to develop in people who have high blood pressure readings or are overweight. Talk with your health care provider about your target blood pressure readings. Have your blood pressure checked: Every 3-5 years if you are 19-58 years of age. Every year if you are 80 years old or older. If you are between the ages of 76 and 38 and are a current or former smoker, ask your health care provider if you should have a one-time screening for abdominal aortic aneurysm (AAA). Diabetes Have regular diabetes screenings. This checks your fasting blood sugar level. Have the screening done: Once every three years after age 46 if you are at a normal weight and have a low risk for diabetes. More often and at a younger age if you are overweight or have a high risk for diabetes. What should I know about preventing infection? Hepatitis B If you have a higher risk for hepatitis B, you should be screened for this virus. Talk with your health care provider to find out if you are at risk for hepatitis B infection. Hepatitis C Blood testing is recommended for: Everyone born from [birth year] through 1964. Anyone with known risk factors for hepatitis C. Sexually transmitted infections (STIs) You should be screened each year for STIs, including gonorrhea and chlamydia, if: You are sexually active and are younger than 77 years of age. You are older than 77  years of age and your  health care provider tells you that you are at risk for this type of infection. Your sexual activity has changed since you were last screened, and you are at increased risk for chlamydia or gonorrhea. Ask your health care provider if you are at risk. Ask your health care provider about whether you are at high risk for HIV. Your health care provider may recommend a prescription medicine to help prevent HIV infection. If you choose to take medicine to prevent HIV, you should first get tested for HIV. You should then be tested every 3 months for as long as you are taking the medicine. Follow these instructions at home: Alcohol use Do not drink alcohol if your health care provider tells you not to drink. If you drink alcohol: Limit how much you have to 0-2 drinks a day. Know how much alcohol is in your drink. In the [place] one drink equals one 12 oz bottle of beer (355 mL), one 5 oz glass of wine (148 mL), or one 1 oz glass of hard liquor (44 mL). Lifestyle Do not use any products that contain nicotine or tobacco. These products include cigarettes, chewing tobacco, and vaping devices, such as e-cigarettes. If you need help quitting, ask your health care provider. Do not use street drugs. Do not share needles. Ask your health care provider for help if you need support or information about quitting drugs. General instructions Schedule regular health, dental, and eye exams. Stay current with your vaccines. Tell your health care provider if: You often feel depressed. You have ever been abused or do not feel safe at home. Summary Adopting a healthy lifestyle and getting preventive care are important in promoting health and wellness. Follow your health care provider's instructions about healthy diet, exercising, and getting tested or screened for diseases. Follow your health care provider's instructions on monitoring your cholesterol and blood pressure. This information is not  intended to replace advice given to you by your health care provider. Make sure you discuss any questions you have with your health care provider. Document Revised: 12/13/2020 Document Reviewed: 12/13/2020 Elsevier Patient Education  2024 Elsevier [employer].     Emil Schaumann, MD  Primary Care at [facility]

## 2024-03-26 NOTE — Telephone Encounter (Signed)
 Copied from CRM #[id number]. Topic: Clinical - Lab/Test Results >> Mar 25, 2024  5:00 PM [name] wrote: Reason for CRM: Patient returning call to [place]. Please contact patient back

## 2024-04-28 ENCOUNTER — Other Ambulatory Visit: Payer: Self-pay | Admitting: Emergency Medicine

## 2024-04-28 ENCOUNTER — Telehealth: Payer: Self-pay | Admitting: Emergency Medicine

## 2024-04-28 NOTE — Telephone Encounter (Signed)
 Prescription Request  04/28/2024  LOV: 03/25/2024  What is the name of the medication or equipment? Meloxicam  20mg    Have you contacted your pharmacy to request a refill? Yes   Which pharmacy would you like this sent to?  [id number] [address], [place] - [address] AT [facility] [address] [place] [address] Phone: [phone] Fax: [phone]    Patient notified that their request is being sent to the clinical staff for review and that they should receive a response within 2 business days.   Please advise at Mobile [phone] (mobile)   Pt was placed on 7.5 he finished that and was told he need to be on 20mg  instead and is wondering if he could get a refill on that med.

## 2024-04-28 NOTE — Telephone Encounter (Signed)
 Not on her medication list.  Who started her on this medication and what for?  She can follow-up with them.

## 2024-05-05 ENCOUNTER — Other Ambulatory Visit: Payer: Self-pay | Admitting: Emergency Medicine

## 2024-05-05 MED ORDER — ROSUVASTATIN CALCIUM 20 MG PO TABS: 20.0000 mg | ORAL_TABLET | Freq: Every day | ORAL | 3 refills | Status: AC

## 2024-05-14 ENCOUNTER — Other Ambulatory Visit: Payer: Self-pay | Admitting: Radiology

## 2024-05-14 DIAGNOSIS — R399 Unspecified symptoms and signs involving the genitourinary system: Secondary | ICD-10-CM

## 2024-05-14 MED ORDER — TAMSULOSIN HCL 0.4 MG PO CAPS: 0.4000 mg | ORAL_CAPSULE | Freq: Every day | ORAL | 3 refills | Status: AC

## 2024-06-30 DIAGNOSIS — R600 Localized edema: Secondary | ICD-10-CM | POA: Diagnosis not present

## 2024-06-30 DIAGNOSIS — N400 Enlarged prostate without lower urinary tract symptoms: Secondary | ICD-10-CM | POA: Diagnosis not present
